# Patient Record
Sex: Female | Born: 1985 | Race: White | Hispanic: No | Marital: Married | Smoking: Never smoker
Health system: Southern US, Community
[De-identification: ages and names within clinical notes are randomized; demographics above are authoritative.]

## PROBLEM LIST (undated history)

## (undated) ENCOUNTER — Inpatient Hospital Stay (HOSPITAL_COMMUNITY): Payer: Self-pay

## (undated) DIAGNOSIS — Z789 Other specified health status: Secondary | ICD-10-CM

## (undated) DIAGNOSIS — F329 Major depressive disorder, single episode, unspecified: Secondary | ICD-10-CM

## (undated) DIAGNOSIS — F32A Depression, unspecified: Secondary | ICD-10-CM

## (undated) HISTORY — PX: WISDOM TOOTH EXTRACTION: SHX21

## (undated) HISTORY — PX: NO PAST SURGERIES: SHX2092

---
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
## 2007-04-26 ENCOUNTER — Emergency Department (HOSPITAL_COMMUNITY): Admission: EM | Admit: 2007-04-26 | Discharge: 2007-04-26 | Payer: Self-pay | Admitting: Emergency Medicine

## 2008-10-07 ENCOUNTER — Encounter (INDEPENDENT_AMBULATORY_CARE_PROVIDER_SITE_OTHER): Payer: Self-pay | Admitting: Internal Medicine

## 2008-10-08 ENCOUNTER — Encounter (INDEPENDENT_AMBULATORY_CARE_PROVIDER_SITE_OTHER): Payer: Self-pay | Admitting: Internal Medicine

## 2008-10-08 ENCOUNTER — Ambulatory Visit: Payer: Self-pay | Admitting: Internal Medicine

## 2008-10-08 DIAGNOSIS — F329 Major depressive disorder, single episode, unspecified: Secondary | ICD-10-CM

## 2008-10-08 LAB — CONVERTED CEMR LAB: Hgb A1c MFr Bld: 4.7 %

## 2008-10-09 LAB — CONVERTED CEMR LAB
ALT: 10 units/L (ref 0–35)
AST: 15 units/L (ref 0–37)
Albumin: 4.5 g/dL (ref 3.5–5.2)
Alkaline Phosphatase: 34 units/L — ABNORMAL LOW (ref 39–117)
BUN: 11 mg/dL (ref 6–23)
CO2: 23 meq/L (ref 19–32)
Calcium: 9.2 mg/dL (ref 8.4–10.5)
Chloride: 103 meq/L (ref 96–112)
Cholesterol: 237 mg/dL — ABNORMAL HIGH (ref 0–200)
Creatinine, Ser: 0.74 mg/dL (ref 0.40–1.20)
GFR calc Af Amer: >60 mL/min (ref >60)
GFR calc non Af Amer: >60 mL/min (ref >60)
Glucose, Bld: 85 mg/dL (ref 70–99)
HCT: 40.9 % (ref 36.0–46.0)
HDL: 85 mg/dL (ref >39)
Hemoglobin: 13.9 g/dL (ref 12.0–15.0)
LDL Cholesterol: 130 mg/dL — ABNORMAL HIGH (ref 0–99)
MCHC: 34 g/dL (ref 30.0–36.0)
MCV: 89.9 fL (ref 78.0–100.0)
Platelets: 265 10*3/uL (ref 150–400)
Potassium: 4.2 meq/L (ref 3.5–5.3)
RBC: 4.55 M/uL (ref 3.87–5.11)
RDW: 12.1 % (ref 11.5–15.5)
Sodium: 138 meq/L (ref 135–145)
TSH: 0.929 microintl units/mL (ref 0.350–4.500)
Total Bilirubin: 0.4 mg/dL (ref 0.3–1.2)
Total CHOL/HDL Ratio: 2.8
Total Protein: 7.5 g/dL (ref 6.0–8.3)
Triglycerides: 110 mg/dL (ref <150)
VLDL: 22 mg/dL (ref 0–40)
WBC: 4.6 10*3/uL (ref 4.0–10.5)

## 2008-10-13 ENCOUNTER — Telehealth: Payer: Self-pay | Admitting: Licensed Clinical Social Worker

## 2008-10-30 ENCOUNTER — Ambulatory Visit: Payer: Self-pay | Admitting: Internal Medicine

## 2008-10-30 ENCOUNTER — Encounter: Payer: Self-pay | Admitting: Licensed Clinical Social Worker

## 2009-01-07 ENCOUNTER — Encounter: Payer: Self-pay | Admitting: Internal Medicine

## 2009-01-07 ENCOUNTER — Ambulatory Visit: Payer: Self-pay | Admitting: Internal Medicine

## 2009-01-07 ENCOUNTER — Telehealth: Payer: Self-pay | Admitting: *Deleted

## 2009-01-07 LAB — CONVERTED CEMR LAB
Cholesterol: 187 mg/dL (ref 0–200)
HDL: 72 mg/dL (ref >39)
LDL Cholesterol: 104 mg/dL — ABNORMAL HIGH (ref 0–99)
Total CHOL/HDL Ratio: 2.6
Triglycerides: 57 mg/dL (ref <150)
VLDL: 11 mg/dL (ref 0–40)

## 2009-03-16 ENCOUNTER — Ambulatory Visit: Payer: Self-pay | Admitting: Infectious Disease

## 2009-03-16 ENCOUNTER — Telehealth: Payer: Self-pay | Admitting: Internal Medicine

## 2009-03-16 LAB — CONVERTED CEMR LAB: Streptococcus, Group A Screen (Direct): NEGATIVE

## 2010-06-17 ENCOUNTER — Other Ambulatory Visit (HOSPITAL_COMMUNITY)
Admission: RE | Admit: 2010-06-17 | Discharge: 2010-06-17 | Disposition: A | Discharge status: Home / Self Care | Payer: BC Managed Care – PPO | Source: Ambulatory Visit | Attending: Family Medicine | Admitting: Family Medicine

## 2010-06-17 DIAGNOSIS — Z01419 Encounter for gynecological examination (general) (routine) without abnormal findings: Secondary | ICD-10-CM | POA: Insufficient documentation

## 2010-07-11 ENCOUNTER — Encounter: Payer: Self-pay | Admitting: Internal Medicine

## 2011-02-20 LAB — RAPID STREP SCREEN (MED CTR MEBANE ONLY): Streptococcus, Group A Screen (Direct): POSITIVE — AB

## 2011-03-12 ENCOUNTER — Emergency Department (HOSPITAL_COMMUNITY): Payer: No Typology Code available for payment source

## 2011-03-12 ENCOUNTER — Emergency Department (HOSPITAL_COMMUNITY)
Admission: EM | Admit: 2011-03-12 | Discharge: 2011-03-12 | Disposition: A | Discharge status: Home / Self Care | Payer: No Typology Code available for payment source | Attending: Emergency Medicine | Admitting: Emergency Medicine

## 2011-03-12 DIAGNOSIS — M545 Low back pain, unspecified: Secondary | ICD-10-CM | POA: Insufficient documentation

## 2011-03-12 DIAGNOSIS — M79609 Pain in unspecified limb: Secondary | ICD-10-CM | POA: Insufficient documentation

## 2011-03-12 DIAGNOSIS — M25559 Pain in unspecified hip: Secondary | ICD-10-CM | POA: Insufficient documentation

## 2011-03-12 DIAGNOSIS — S8010XA Contusion of unspecified lower leg, initial encounter: Secondary | ICD-10-CM | POA: Insufficient documentation

## 2012-07-08 ENCOUNTER — Inpatient Hospital Stay (HOSPITAL_COMMUNITY): Admission: AD | Admit: 2012-07-08 | Payer: Self-pay | Source: Ambulatory Visit | Admitting: Obstetrics and Gynecology

## 2012-08-05 LAB — OB RESULTS CONSOLE HEPATITIS B SURFACE ANTIGEN: Hepatitis B Surface Ag: NEGATIVE

## 2012-08-05 LAB — OB RESULTS CONSOLE RPR: RPR: NONREACTIVE

## 2012-08-05 LAB — OB RESULTS CONSOLE ANTIBODY SCREEN: Antibody Screen: NEGATIVE

## 2013-01-17 LAB — OB RESULTS CONSOLE GC/CHLAMYDIA
Chlamydia: NEGATIVE
Chlamydia: NEGATIVE
Gonorrhea: NEGATIVE

## 2013-01-17 LAB — OB RESULTS CONSOLE GBS: GBS: NEGATIVE

## 2013-02-09 ENCOUNTER — Inpatient Hospital Stay (HOSPITAL_COMMUNITY): Admission: AD | Admit: 2013-02-09 | Payer: Self-pay | Source: Ambulatory Visit | Admitting: Obstetrics and Gynecology

## 2013-02-10 ENCOUNTER — Inpatient Hospital Stay (HOSPITAL_COMMUNITY): Payer: Self-pay | Admitting: Anesthesiology

## 2013-02-10 ENCOUNTER — Encounter (HOSPITAL_COMMUNITY): Payer: Self-pay | Admitting: *Deleted

## 2013-02-10 ENCOUNTER — Encounter (HOSPITAL_COMMUNITY): Payer: Self-pay | Admitting: Anesthesiology

## 2013-02-10 ENCOUNTER — Inpatient Hospital Stay (HOSPITAL_COMMUNITY)
Admission: AD | Admit: 2013-02-10 | Discharge: 2013-02-12 | DRG: 774 | Disposition: A | Discharge status: Home / Self Care | Payer: Self-pay | Source: Ambulatory Visit | Attending: Obstetrics and Gynecology | Admitting: Obstetrics and Gynecology

## 2013-02-10 DIAGNOSIS — O99344 Other mental disorders complicating childbirth: Secondary | ICD-10-CM | POA: Diagnosis present

## 2013-02-10 DIAGNOSIS — Z141 Cystic fibrosis carrier: Secondary | ICD-10-CM

## 2013-02-10 DIAGNOSIS — F411 Generalized anxiety disorder: Secondary | ICD-10-CM | POA: Diagnosis present

## 2013-02-10 LAB — COMPREHENSIVE METABOLIC PANEL
ALT: 12 U/L (ref 0–35)
AST: 15 U/L (ref 0–37)
Alkaline Phosphatase: 136 U/L — ABNORMAL HIGH (ref 39–117)
BUN: 9 mg/dL (ref 6–23)
CO2: 19 mEq/L (ref 19–32)
Calcium: 9 mg/dL (ref 8.4–10.5)
Chloride: 100 mEq/L (ref 96–112)
GFR calc Af Amer: >90 mL/min (ref >90)
GFR calc non Af Amer: >90 mL/min (ref >90)
Glucose, Bld: 90 mg/dL (ref 70–99)
Potassium: 4 mEq/L (ref 3.5–5.1)
Sodium: 133 mEq/L — ABNORMAL LOW (ref 135–145)

## 2013-02-10 LAB — CBC
HCT: 33.5 % — ABNORMAL LOW (ref 36.0–46.0)
Hemoglobin: 10.8 g/dL — ABNORMAL LOW (ref 12.0–15.0)
Hemoglobin: 9.7 g/dL — ABNORMAL LOW (ref 12.0–15.0)
MCH: 26.8 pg (ref 26.0–34.0)
MCH: 27.5 pg (ref 26.0–34.0)
MCHC: 32.2 g/dL (ref 30.0–36.0)
MCV: 83.1 fL (ref 78.0–100.0)
Platelets: 208 10*3/uL (ref 150–400)
RBC: 4.03 MIL/uL (ref 3.87–5.11)
RBC: 3.53 MIL/uL — ABNORMAL LOW (ref 3.87–5.11)
RDW: 14.3 % (ref 11.5–15.5)
WBC: 12.8 10*3/uL — ABNORMAL HIGH (ref 4.0–10.5)

## 2013-02-10 LAB — URIC ACID: Uric Acid, Serum: 4.2 mg/dL (ref 2.4–7.0)

## 2013-02-10 LAB — TYPE AND SCREEN: ABO/RH(D): A POS

## 2013-02-10 LAB — PROTEIN / CREATININE RATIO, URINE
Protein Creatinine Ratio: 0.23 — ABNORMAL HIGH (ref 0.00–0.15)
Total Protein, Urine: 12.4 mg/dL

## 2013-02-10 LAB — RPR: RPR Ser Ql: NONREACTIVE

## 2013-02-10 MED ORDER — SIMETHICONE 80 MG PO CHEW: 80.0000 mg | CHEWABLE_TABLET | ORAL | Status: DC | PRN

## 2013-02-10 MED ORDER — LANOLIN HYDROUS EX OINT: TOPICAL_OINTMENT | CUTANEOUS | Status: DC | PRN

## 2013-02-10 MED ORDER — EPHEDRINE 5 MG/ML INJ
10.0000 mg | INTRAVENOUS | Status: DC | PRN
  Filled 2013-02-10: qty 2
  Filled 2013-02-10: qty 4

## 2013-02-10 MED ORDER — FERROUS SULFATE 325 (65 FE) MG PO TABS
325.0000 mg | ORAL_TABLET | Freq: Two times a day (BID) | ORAL | Status: DC
  Administered 2013-02-11 – 2013-02-12 (×3): 325 mg via ORAL
  Filled 2013-02-10 (×3): qty 1

## 2013-02-10 MED ORDER — ONDANSETRON HCL 4 MG/2ML IJ SOLN: 4.0000 mg | INTRAMUSCULAR | Status: DC | PRN

## 2013-02-10 MED ORDER — PRENATAL MULTIVITAMIN CH
1.0000 | ORAL_TABLET | Freq: Every day | ORAL | Status: DC
  Administered 2013-02-11 – 2013-02-12 (×2): 1 via ORAL
  Filled 2013-02-10 (×2): qty 1

## 2013-02-10 MED ORDER — OXYTOCIN 40 UNITS IN LACTATED RINGERS INFUSION - SIMPLE MED
62.5000 mL/h | INTRAVENOUS | Status: DC
  Filled 2013-02-10: qty 1000

## 2013-02-10 MED ORDER — ZOLPIDEM TARTRATE 5 MG PO TABS: 5.0000 mg | ORAL_TABLET | Freq: Every evening | ORAL | Status: DC | PRN

## 2013-02-10 MED ORDER — FENTANYL 2.5 MCG/ML BUPIVACAINE 1/10 % EPIDURAL INFUSION (WH - ANES)
14.0000 mL/h | INTRAMUSCULAR | Status: DC | PRN
  Filled 2013-02-10: qty 125

## 2013-02-10 MED ORDER — ONDANSETRON HCL 4 MG/2ML IJ SOLN: 4.0000 mg | Freq: Four times a day (QID) | INTRAMUSCULAR | Status: DC | PRN

## 2013-02-10 MED ORDER — OXYCODONE-ACETAMINOPHEN 5-325 MG PO TABS: 1.0000 | ORAL_TABLET | ORAL | Status: DC | PRN

## 2013-02-10 MED ORDER — LACTATED RINGERS IV SOLN: 500.0000 mL | Freq: Once | INTRAVENOUS | Status: DC

## 2013-02-10 MED ORDER — BENZOCAINE-MENTHOL 20-0.5 % EX AERO
1.0000 "application " | INHALATION_SPRAY | CUTANEOUS | Status: DC | PRN
  Administered 2013-02-11: 1 via TOPICAL
  Filled 2013-02-10: qty 56

## 2013-02-10 MED ORDER — FENTANYL 2.5 MCG/ML BUPIVACAINE 1/10 % EPIDURAL INFUSION (WH - ANES)
INTRAMUSCULAR | Status: DC | PRN
  Administered 2013-02-10: 14 mL/h via EPIDURAL

## 2013-02-10 MED ORDER — ONDANSETRON HCL 4 MG PO TABS: 4.0000 mg | ORAL_TABLET | ORAL | Status: DC | PRN

## 2013-02-10 MED ORDER — LACTATED RINGERS IV SOLN: 500.0000 mL | INTRAVENOUS | Status: DC | PRN

## 2013-02-10 MED ORDER — DIPHENHYDRAMINE HCL 50 MG/ML IJ SOLN: 12.5000 mg | INTRAMUSCULAR | Status: DC | PRN

## 2013-02-10 MED ORDER — FENTANYL CITRATE 0.05 MG/ML IJ SOLN
INTRAMUSCULAR | Status: AC
  Administered 2013-02-10: 100 ug via INTRAVENOUS
  Filled 2013-02-10: qty 2

## 2013-02-10 MED ORDER — CITRIC ACID-SODIUM CITRATE 334-500 MG/5ML PO SOLN: 30.0000 mL | ORAL | Status: DC | PRN

## 2013-02-10 MED ORDER — EPHEDRINE 5 MG/ML INJ
10.0000 mg | INTRAVENOUS | Status: DC | PRN
  Filled 2013-02-10: qty 2

## 2013-02-10 MED ORDER — IBUPROFEN 600 MG PO TABS: 600.0000 mg | ORAL_TABLET | Freq: Four times a day (QID) | ORAL | Status: DC | PRN

## 2013-02-10 MED ORDER — IBUPROFEN 600 MG PO TABS
600.0000 mg | ORAL_TABLET | Freq: Four times a day (QID) | ORAL | Status: DC
  Administered 2013-02-10 – 2013-02-12 (×7): 600 mg via ORAL
  Filled 2013-02-10 (×7): qty 1

## 2013-02-10 MED ORDER — PHENYLEPHRINE 40 MCG/ML (10ML) SYRINGE FOR IV PUSH (FOR BLOOD PRESSURE SUPPORT)
80.0000 ug | PREFILLED_SYRINGE | INTRAVENOUS | Status: DC | PRN
  Filled 2013-02-10: qty 2

## 2013-02-10 MED ORDER — LACTATED RINGERS IV SOLN
INTRAVENOUS | Status: DC
  Administered 2013-02-10: 12:00:00 via INTRAVENOUS
  Administered 2013-02-10: 125 mL/h via INTRAVENOUS

## 2013-02-10 MED ORDER — PHENYLEPHRINE 40 MCG/ML (10ML) SYRINGE FOR IV PUSH (FOR BLOOD PRESSURE SUPPORT)
80.0000 ug | PREFILLED_SYRINGE | INTRAVENOUS | Status: DC | PRN
  Filled 2013-02-10: qty 5
  Filled 2013-02-10: qty 2

## 2013-02-10 MED ORDER — ACETAMINOPHEN 325 MG PO TABS
650.0000 mg | ORAL_TABLET | ORAL | Status: DC | PRN
  Administered 2013-02-10: 650 mg via ORAL
  Filled 2013-02-10: qty 2

## 2013-02-10 MED ORDER — WITCH HAZEL-GLYCERIN EX PADS: 1.0000 "application " | MEDICATED_PAD | CUTANEOUS | Status: DC | PRN

## 2013-02-10 MED ORDER — FENTANYL CITRATE 0.05 MG/ML IJ SOLN
100.0000 ug | INTRAMUSCULAR | Status: DC | PRN
  Administered 2013-02-10: 100 ug via INTRAVENOUS

## 2013-02-10 MED ORDER — LIDOCAINE HCL (PF) 1 % IJ SOLN
30.0000 mL | INTRAMUSCULAR | Status: DC | PRN
  Filled 2013-02-10 (×2): qty 30

## 2013-02-10 MED ORDER — SENNOSIDES-DOCUSATE SODIUM 8.6-50 MG PO TABS
2.0000 | ORAL_TABLET | ORAL | Status: DC
  Administered 2013-02-10 – 2013-02-12 (×2): 2 via ORAL

## 2013-02-10 MED ORDER — OXYTOCIN BOLUS FROM INFUSION
500.0000 mL | INTRAVENOUS | Status: DC
  Administered 2013-02-10: 500 mL via INTRAVENOUS

## 2013-02-10 MED ORDER — DIBUCAINE 1 % RE OINT: 1.0000 "application " | TOPICAL_OINTMENT | RECTAL | Status: DC | PRN

## 2013-02-10 MED ORDER — LIDOCAINE HCL (PF) 1 % IJ SOLN
INTRAMUSCULAR | Status: DC | PRN
  Administered 2013-02-10: 4 mL
  Administered 2013-02-10: 30 mL
  Administered 2013-02-10: 4 mL

## 2013-02-10 MED ORDER — TETANUS-DIPHTH-ACELL PERTUSSIS 5-2.5-18.5 LF-MCG/0.5 IM SUSP: 0.5000 mL | Freq: Once | INTRAMUSCULAR | Status: DC

## 2013-02-10 NOTE — MAU Note (Signed)
Contractions every 4-7 minutes for the last two hours. Denies gushes of fluid and vaginal bleeding

## 2013-02-10 NOTE — H&P (Signed)
Amy Jacobson is a 27 y.o. female presenting for labor check at [redacted]w[redacted]d. Pt denies any VB or LOF, +FM. Denies any HA/N/V/RUQ pain, does have hx anxiety, ctx began to get more regular and stronger about 3am.  HPI: Pt tx care to [facility] at 32wks,  from [facility] desires WB. Pt began PNC at 8wks, EDC confirmed w early [place] c/w LMP dating =02/09/13. 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 Anatomy scan at [facility] WNL w posterior placenta  Normal 1hr gtt in 3rd trimester Otherwise normal PNC, GBS, GC/CT neg  Maternal Medical History:  Reason for admission: Contractions.  Nausea.  Contractions: Onset was 3-5 hours ago.   Frequency: regular.   Duration is approximately 60 seconds.   Perceived severity is moderate.    Fetal activity: Perceived fetal activity is normal.   Last perceived fetal movement was within the past hour.    Prenatal complications: no prenatal complications Prenatal Complications - Diabetes: none.    OB History   Grav Para Term Preterm Abortions TAB SAB Ect Mult Living   1              History reviewed. No pertinent past medical history. History reviewed. No pertinent past surgical history. Family History: family history includes Diabetes in her father. Social History:  reports that she has never smoked. She does not have any smokeless tobacco history on file. She reports that she does not drink alcohol or use illicit drugs.   Prenatal Transfer Tool  Maternal Diabetes: No Genetic Screening: Declined Maternal Ultrasounds/Referrals: Normal Fetal Ultrasounds or other Referrals:  None Maternal Substance Abuse:  No Significant Maternal Medications:  No  Significant Maternal Lab Results:  Lab values include: Group B Strep negative Other Comments:  None  Review of Systems  Eyes: Negative for blurred vision.  Gastrointestinal: Negative for nausea, vomiting and abdominal pain.  Neurological: Negative for headaches.  Psychiatric/Behavioral: The patient is  nervous/anxious.   All other systems reviewed and are negative.    Dilation: 5 Effacement (%): 100 Exam by:: Krystel Fletchall CNM Blood pressure 122/84, pulse 113, temperature 97.9 F (36.6 C), temperature source Oral, resp. rate 20, height 5\' 2"  (1.575 m), weight 194 lb 8 oz (88.225 kg). Maternal Exam:  Uterine Assessment: Contraction strength is moderate.  Contraction duration is 60 seconds. Contraction frequency is regular.   Abdomen: Patient reports no abdominal tenderness. Fundal height is aga.   Estimated fetal weight is 7-8#.   Fetal presentation: vertex  Introitus: Normal vulva. Normal vagina.  Pelvis: adequate for delivery.   Cervix: Cervix evaluated by digital exam.     Fetal Exam Fetal Monitor Review: Mode: ultrasound.   Baseline rate: 140.  Variability: moderate (6-25 bpm).   Pattern: accelerations present and no decelerations.    Fetal State Assessment: Category I - tracings are normal.     Physical Exam  Nursing note and vitals reviewed. Constitutional: She is oriented to person, place, and time. She appears well-developed and well-nourished. She appears distressed.  Grimacing and moaning w ctx, tense  HENT:  Head: Normocephalic.  Eyes: Pupils are equal, round, and reactive to light.  Neck: Normal range of motion.  Cardiovascular: Normal rate, regular rhythm and normal heart sounds.   Respiratory: Effort normal and breath sounds normal.  GI: Soft. Bowel sounds are normal.  Genitourinary: Vagina normal.  Musculoskeletal: Normal range of motion.  Neurological: She is alert and oriented to person, place, and time. She has normal reflexes.  Skin: Skin is  warm and dry.  Psychiatric: She has a normal mood and affect. Her behavior is normal.    Prenatal labs: ABO, Rh:  A pos 3/24 Antibody:  neg 3/24 Rubella:  IMM 3/24 RPR:   NR 3/24 HBsAg:   neg 3/24 HIV:   neg 3/24  GBS: Negative (09/05 0000)  GC/CT neg 9/5   Assessment/Plan: IUP at [redacted]w[redacted]d Active labor FHR  reassuring Elevated BP GBS neg Pt desires WB  Admit to b.s. Per c/w Dr Stefano Gaul Routine L&D orders  Will check PIH labs and PCR to r/o pre-eclampsia  CTO BP closely  Expectant mgmnt Ok for WB if BP normalizes and no sx's pre-eclampsia   Jahnia Hewes M 02/10/2013, 8:15 AM

## 2013-02-10 NOTE — Progress Notes (Signed)
Amy Jacobson is a 27 y.o. G1P0 at [redacted]w[redacted]d by LMP admitted for active labor  Subjective: Pt breathing through contractions in tub, but now asking for analgesia IV.  Cx 8 cm per RN now that pt SROM'D.    Objective: BP 144/72  Pulse 98  Temp(Src) 99 F (37.2 C) (Oral)  Resp 20  Ht 5\' 2"  (1.575 m)  Wt 194 lb 8 oz (88.225 kg)  BMI 35.57 kg/m2      FHT:  140'S WITH some variables.  Pt will come out of water for longer tracing prior to analgesia UC:   regular, every 3 minutes SVE:   Dilation: Lip/rim Effacement (%): 100 Station: 0 Exam by:: Sowder,RNC   Labs: Lab Results  Component Value Date   WBC 7.1 02/10/2013   HGB 10.8* 02/10/2013   HCT 33.5* 02/10/2013   MCV 83.1 02/10/2013   PLT 240 02/10/2013    Assessment / Plan: Spontaneous labor, progressing normally  Labor: decrease in cervical dilation after SROM Preeclampsia:  no signs or symptoms of toxicity and PIH labs all nl Fetal Wellbeing:  Category I Pain Control:  Fentanyl I/D:  n/a Anticipated MOD:  NSVD  [name] P 02/10/2013, 9:54 AM

## 2013-02-10 NOTE — Anesthesia Procedure Notes (Signed)
Epidural Patient location during procedure: OB Start time: 02/10/2013 10:46 AM  Staffing Anesthesiologist: Cionna Collantes A. Performed by: anesthesiologist   Preanesthetic Checklist Completed: patient identified, site marked, surgical consent, pre-op evaluation, timeout performed, IV checked, risks and benefits discussed and monitors and equipment checked  Epidural Patient position: sitting Prep: site prepped and draped and DuraPrep Patient monitoring: continuous pulse ox and blood pressure Approach: midline Injection technique: LOR air  Needle:  Needle type: Tuohy  Needle gauge: 17 G Needle length: 9 cm and 9 Needle insertion depth: 6 cm Catheter type: closed end flexible Catheter size: 19 Gauge Catheter at skin depth: 11 cm Test dose: negative and Other  Assessment Events: blood not aspirated, injection not painful, no injection resistance, negative IV test and no paresthesia  Additional Notes Patient identified. Risks and benefits discussed including failed block, incomplete  Pain control, post dural puncture headache, nerve damage, paralysis, blood pressure Changes, nausea, vomiting, reactions to medications-both toxic and allergic and post Partum back pain. All questions were answered. Patient expressed understanding and wished to proceed. Sterile technique was used throughout procedure. Epidural site was Dressed with sterile barrier dressing. No paresthesias, signs of intravascular injection Or signs of intrathecal spread were encountered.  Patient was more comfortable after the epidural was dosed. Please see RN's note for documentation of vital signs and FHR which are stable.

## 2013-02-10 NOTE — Op Note (Signed)
Delivery Note At 5:09 PM a viable female was delivered via Vaginal, Spontaneous Delivery (Presentation: ; Occiput Anterior).  APGAR: 9, 9; weight pending   Placenta status: Intact, Spontaneous.  Cord: 3 vessels with the following complications: None.  Cord pH: not done  Anesthesia: Epidural  Episiotomy: None Lacerations: 2nd degree;Perineal Suture Repair: 3.0 vicryl Est. Blood Loss (mL): 300  Mom to postpartum.  Baby to skin to skin.  Pavan Bring P 02/10/2013, 5:57 PM

## 2013-02-10 NOTE — Progress Notes (Signed)
Amy Jacobson is a 27 y.o. G1P0 at [redacted]w[redacted]d by ultrasound admitted for active labor  Subjective: Patient decided that analgesia after no was inadequate and requested an epidural. Since that time. She has been in spontaneous labor and progressing slowly. She declined Pitocin augmentation. She subsequently progressed to complete and began pushing at approximately 1600.  Objective: BP 127/62  Pulse 121  Temp(Src) 98.3 F (36.8 C) (Oral)  Resp 20  Ht 5\' 2"  (1.575 m)  Wt 194 lb 8 oz (88.225 kg)  BMI 35.57 kg/m2  SpO2 100%   Total I/O In: -  Out: 500 [Urine:200; Blood:300]  FHT:  Mild tachycardia noted. Just prior to pushing. Since pushing the patient has had variable decelerations with most contractions, but good return to a baseline in the 150s. UC:   regular, every 2-3 minutes SVE:   Dilation: 10 Effacement (%): 100 Station: +2 Exam by:: Sowder,RNC  Labs: Lab Results  Component Value Date   WBC 7.1 02/10/2013   HGB 10.8* 02/10/2013   HCT 33.5* 02/10/2013   MCV 83.1 02/10/2013   PLT 240 02/10/2013    Assessment / Plan: Protracted active phase  Labor: Patient declined Pitocin for protracted active phase Preeclampsia:  no signs or symptoms of toxicity Fetal Wellbeing:  Category II Pain Control:  Epidural adequate I/D:  Will observe closely for occurrence of a fever. The patient is given Tylenol and a fluid bolus with good fetal heart rate response. Anticipated MOD:  NSVD  Sivan Quast P 02/10/2013, 5:44 PM

## 2013-02-10 NOTE — Anesthesia Preprocedure Evaluation (Signed)
Anesthesia Evaluation  Patient identified by MRN, date of birth, ID band Patient awake    Reviewed: Allergy & Precautions, H&P , Patient's Chart, lab work & pertinent test results  Airway Mallampati: III TM Distance: >3 FB Neck ROM: Full    Dental no notable dental hx. (+) Teeth Intact   Pulmonary neg pulmonary ROS,  breath sounds clear to auscultation  Pulmonary exam normal       Cardiovascular negative cardio ROS  Rhythm:Regular Rate:Normal     Neuro/Psych PSYCHIATRIC DISORDERS Depression negative neurological ROS     GI/Hepatic Neg liver ROS, GERD-  ,  Endo/Other  negative endocrine ROSObesity  Renal/GU negative Renal ROS  negative genitourinary   Musculoskeletal negative musculoskeletal ROS (+)   Abdominal (+) + obese,   Peds  Hematology negative hematology ROS (+)   Anesthesia Other Findings   Reproductive/Obstetrics                           Anesthesia Physical Anesthesia Plan  ASA: II  Anesthesia Plan: Epidural   Post-op Pain Management:    Induction:   Airway Management Planned: Natural Airway  Additional Equipment:   Intra-op Plan:   Post-operative Plan:   Informed Consent: I have reviewed the patients History and Physical, chart, labs and discussed the procedure including the risks, benefits and alternatives for the proposed anesthesia with the patient or authorized representative who has indicated his/her understanding and acceptance.   Dental advisory given  Plan Discussed with: Anesthesiologist  Anesthesia Plan Comments:         Anesthesia Quick Evaluation

## 2013-02-10 NOTE — Progress Notes (Signed)
Discussing POC for pitocin with patient and family.  Risks of prolonged active phase with ROM discussed.  Pt refusing pitocin at this time.  Wishes to sit up and try gravity.  Pitocin held for now and Dr. Pennie Rushing notified.

## 2013-02-11 ENCOUNTER — Encounter (HOSPITAL_COMMUNITY): Payer: Self-pay | Admitting: *Deleted

## 2013-02-11 LAB — CBC
HCT: 25.8 % — ABNORMAL LOW (ref 36.0–46.0)
Hemoglobin: 8.6 g/dL — ABNORMAL LOW (ref 12.0–15.0)
MCH: 27.7 pg (ref 26.0–34.0)
MCHC: 33.3 g/dL (ref 30.0–36.0)
MCV: 83.2 fL (ref 78.0–100.0)

## 2013-02-11 MED ORDER — OXYCODONE-ACETAMINOPHEN 5-325 MG PO TABS: 1.0000 | ORAL_TABLET | ORAL | Status: DC | PRN

## 2013-02-11 MED ORDER — IBUPROFEN 800 MG PO TABS: 800.0000 mg | ORAL_TABLET | Freq: Three times a day (TID) | ORAL | Status: DC | PRN

## 2013-02-11 MED ORDER — FERROUS SULFATE 325 (65 FE) MG PO TABS: 325.0000 mg | ORAL_TABLET | Freq: Two times a day (BID) | ORAL | Status: DC

## 2013-02-11 MED ORDER — DOCUSATE SODIUM 100 MG PO CAPS: 100.0000 mg | ORAL_CAPSULE | Freq: Two times a day (BID) | ORAL | Status: DC

## 2013-02-11 NOTE — Progress Notes (Signed)
Patient had been d/c'd today by Dr. Stefano Gaul on day 1, but baby is not to be d/c'd until tomorrow. Patient requests her d/c be cancelled--d/c order cancelled. Will anticipate d/c tomorrow.  Nigel Bridgeman, CNM 02/11/13 8:15p

## 2013-02-11 NOTE — Discharge Summary (Addendum)
Vaginal Delivery Discharge Summary  Amy Jacobson  DOB:    Aug 02, 1985 MRN:    [id number] CSN:    [id number]  Date of admission:                  02/10/2013  Date of discharge:                   02/12/2013  Procedures this admission:  Date of Delivery: 02/10/2013 normal spontaneous vaginal delivery by Dr. Dierdre Forth  Newborn Data:  Live born female  Birth Weight: 7 lb 13.4 oz (3555 g) APGAR: 9, 9  Home with mother. Name: Esekial Circumcision Plan: Outpatient  History of Present Illness:  Amy Jacobson is a 27 y.o. female, G1P1001, who presents at [redacted]w[redacted]d weeks gestation. The patient has been followed at the [facility] division of [facility]. She was admitted onset of labor. Her pregnancy has been complicated by: Anxiety.  Hospital course:  The patient was admitted for labor.   Her labor was not complicated. She initially planned waterbirth, then elected to proceed with epidural.  She proceeded to have a vaginal delivery of a healthy infant. Her delivery was not complicated. Her postpartum course was not complicated. She was discharged to home on postpartum day 1 doing well.  Feeding:  breast  Contraception:  no method  Discharge hemoglobin:  Hemoglobin  Date Value Range Status  02/11/2013 8.6* 12.0 - 15.0 g/dL Final     HCT  Date Value Range Status  02/11/2013 25.8* 36.0 - 46.0 % Final    Discharge Physical Exam:   Orthostatic blood pressures and pulses are normal. The patient is able to ambulate without difficulty.  General: no distress Lochia: appropriate Uterine Fundus: firm Incision: NA DVT Evaluation: No evidence of DVT seen on physical exam.  Intrapartum Procedures: spontaneous vaginal delivery Postpartum Procedures: none Complications-Operative and Postpartum: Second degree perineal laceration  Discharge Diagnoses: Term Pregnancy-delivered  Discharge Information:  Activity:            pelvic rest Diet:                routine Medications: PNV, Ibuprofen, Colace, Iron and Percocet Condition:      stable and improved Instructions:   Postpartum Care After Vaginal Delivery  After you deliver your newborn (postpartum period), the usual stay in the hospital is 24 72 hours. If there were problems with your labor or delivery, or if you have other medical problems, you might be in the hospital longer.  While you are in the hospital, you will receive help and instructions on how to care for yourself and your newborn during the postpartum period.  While you are in the hospital:  Be sure to tell your nurses if you have pain or discomfort, as well as where you feel the pain and what makes the pain worse.  If you had an incision made near your vagina (episiotomy) or if you had some tearing during delivery, the nurses may put ice packs on your episiotomy or tear. The ice packs may help to reduce the pain and swelling.  If you are breastfeeding, you may feel uncomfortable contractions of your uterus for a couple of weeks. This is normal. The contractions help your uterus get back to normal size.  It is normal to have some bleeding after delivery.  For the first 1 3 days after delivery, the flow is red and the amount may be similar to  a period.  It is common for the flow to start and stop.  In the first few days, you may pass some small clots. Let your nurses know if you begin to pass large clots or your flow increases.  Do not  flush blood clots down the toilet before having the nurse look at them.  During the next 3 10 days after delivery, your flow should become more watery and pink or brown-tinged in color.  Ten to fourteen days after delivery, your flow should be a small amount of yellowish-white discharge.  The amount of your flow will decrease over the first few weeks after delivery. Your flow may stop in 6 8 weeks. Most women have had their flow stop by 12 weeks after  delivery.  You should change your sanitary pads frequently.  Wash your hands thoroughly with soap and water for at least 20 seconds after changing pads, using the toilet, or before holding or feeding your newborn.  You should feel like you need to empty your bladder within the first 6 8 hours after delivery.  In case you become weak, lightheaded, or faint, call your nurse before you get out of bed for the first time and before you take a shower for the first time.  Within the first few days after delivery, your breasts may begin to feel tender and full. This is called engorgement. Breast tenderness usually goes away within 48 72 hours after engorgement occurs. You may also notice milk leaking from your breasts. If you are not breastfeeding, do not stimulate your breasts. Breast stimulation can make your breasts produce more milk.  Spending as much time as possible with your newborn is very important. During this time, you and your newborn can feel close and get to know each other. Having your newborn stay in your room (rooming in) will help to strengthen the bond with your newborn. It will give you time to get to know your newborn and become comfortable caring for your newborn.  Your hormones change after delivery. Sometimes the hormone changes can temporarily cause you to feel sad or tearful. These feelings should not last more than a few days. If these feelings last longer than that, you should talk to your caregiver.  If desired, talk to your caregiver about methods of family planning or contraception.  Talk to your caregiver about immunizations. Your caregiver may want you to have the following immunizations before leaving the hospital:  Tetanus, diphtheria, and pertussis (Tdap) or tetanus and diphtheria (Td) immunization. It is very important that you and your family (including grandparents) or others caring for your newborn are up-to-date with the Tdap or Td immunizations. The Tdap or Td  immunization can help protect your newborn from getting ill.  Rubella immunization.  Varicella (chickenpox) immunization.  Influenza immunization. You should receive this annual immunization if you did not receive the immunization during your pregnancy. Document Released: 02/26/2007 Document Revised: 01/24/2012 Document Reviewed: 12/27/2011 [facility] Patient Information 2014 [place], [place].   Postpartum Depression and Baby Blues  The postpartum period begins right after the birth of a baby. During this time, there is often a great amount of joy and excitement. It is also a time of considerable changes in the life of the parent(s). Regardless of how many times a mother gives birth, each child brings new challenges and dynamics to the family. It is not unusual to have feelings of excitement accompanied by confusing shifts in moods, emotions, and thoughts. All mothers are at risk  of developing postpartum depression or the "baby blues." These mood changes can occur right after giving birth, or they may occur many months after giving birth. The baby blues or postpartum depression can be mild or severe. Additionally, postpartum depression can resolve rather quickly, or it can be a long-term condition. CAUSES Elevated hormones and their rapid decline are thought to be a main cause of postpartum depression and the baby blues. There are a number of hormones that radically change during and after pregnancy. Estrogen and progesterone usually decrease immediately after delivering your baby. The level of thyroid hormone and various cortisol steroids also rapidly drop. Other factors that play a major role in these changes include major life events and genetics.  RISK FACTORS If you have any of the following risks for the baby blues or postpartum depression, know what symptoms to watch out for during the postpartum period. Risk factors that may increase the likelihood of getting the baby blues or postpartum  depression include:  Havinga personal or family history of depression.  Having depression while being pregnant.  Having premenstrual or oral contraceptive-associated mood issues.  Having exceptional life stress.  Having marital conflict.  Lacking a social support network.  Having a baby with special needs.  Having health problems such as diabetes. SYMPTOMS Baby blues symptoms include:  Brief fluctuations in mood, such as going from extreme happiness to sadness.  Decreased concentration.  Difficulty sleeping.  Crying spells, tearfulness.  Irritability.  Anxiety. Postpartum depression symptoms typically begin within the first month after giving birth. These symptoms include:  Difficulty sleeping or excessive sleepiness.  Marked weight loss.  Agitation.  Feelings of worthlessness.  Lack of interest in activity or food. Postpartum psychosis is a very concerning condition and can be dangerous. Fortunately, it is rare. Displaying any of the following symptoms is cause for immediate medical attention. Postpartum psychosis symptoms include:  Hallucinations and delusions.  Bizarre or disorganized behavior.  Confusion or disorientation. DIAGNOSIS  A diagnosis is made by an evaluation of your symptoms. There are no medical or lab tests that lead to a diagnosis, but there are various questionnaires that a caregiver may use to identify those with the baby blues, postpartum depression, or psychosis. Often times, a screening tool called the [place] Postnatal Depression Scale is used to diagnose depression in the postpartum period.  TREATMENT The baby blues usually goes away on its own in 1 to 2 weeks. Social support is often all that is needed. You should be encouraged to get adequate sleep and rest. Occasionally, you may be given medicines to help you sleep.  Postpartum depression requires treatment as it can last several months or longer if it is not treated. Treatment may  include individual or group therapy, medicine, or both to address any social, physiological, and psychological factors that may play a role in the depression. Regular exercise, a healthy diet, rest, and social support may also be strongly recommended.  Postpartum psychosis is more serious and needs treatment right away. Hospitalization is often needed. HOME CARE INSTRUCTIONS  Get as much rest as you can. Nap when the baby sleeps.  Exercise regularly. Some women find yoga and walking to be beneficial.  Eat a balanced and nourishing diet.  Do little things that you enjoy. Have a cup of tea, take a bubble bath, read your favorite magazine, or listen to your favorite music.  Avoid alcohol.  Ask for help with household chores, cooking, grocery shopping, or running errands as needed. Do not try  to do everything.  Talk to people close to you about how you are feeling. Get support from your partner, family members, friends, or other new moms.  Try to stay positive in how you think. Think about the things you are grateful for.  Do not spend a lot of time alone.  Only take medicine as directed by your caregiver.  Keep all your postpartum appointments.  Let your caregiver know if you have any concerns. SEEK MEDICAL CARE IF: You are having a reaction or problems with your medicine. SEEK IMMEDIATE MEDICAL CARE IF:  You have suicidal feelings.  You feel you may harm the baby or someone else. Document Released: 02/03/2004 Document Revised: 07/24/2011 Document Reviewed: 03/07/2011 [facility] Patient Information 2014 [place], [place].   Discharge to: home  Follow-up Information   Follow up with [facility] In 6 weeks.   Contact information:   [address] [place] [address]       Nigel Bridgeman, CNM 02/12/13

## 2013-02-11 NOTE — Progress Notes (Signed)
Patient was referred for history of depression/anxiety.  * Referral screened out by Clinical Social Worker because none of the following criteria appear to apply:  ~ History of anxiety/depression during this pregnancy, or of post-partum depression.  ~ Diagnosis of anxiety and/or depression within last 4 years, per pt.  ~ History of depression due to pregnancy loss/loss of child  OR  * Patient's symptoms currently being treated with medication and/or therapy.  Please contact the Clinical Social Worker if needs arise, or by the patient's request.       

## 2013-02-11 NOTE — Anesthesia Postprocedure Evaluation (Signed)
Anesthesia Post Note  Patient: Amy Jacobson  Procedure(s) Performed: * No procedures listed *  Anesthesia type: Epidural  Patient location: Mother/Baby  Post pain: Pain level controlled  Post assessment: Post-op Vital signs reviewed  Last Vitals:  Filed Vitals:   02/11/13 0745  BP: 117/80  Pulse: 103  Temp: 37.1 C  Resp: 18    Post vital signs: Reviewed  Level of consciousness:alert  Complications: No apparent anesthesia complications

## 2013-02-12 NOTE — Progress Notes (Signed)
Post Partum Day 2 Subjective:  Well. Lochia are normal. Voiding, ambulating, tolerating normal diet. nursing going well. Awaiting BM from baby for discharge home  Objective: Blood pressure 110/62, pulse 84, temperature 98.5 F (36.9 C), temperature source Oral, resp. rate 22, height 5\' 2"  (1.575 m), weight 194 lb 8 oz (88.225 kg), SpO2 99.00%, unknown if currently breastfeeding.  Physical Exam:  General: normal Lochia: appropriate Uterine Fundus: 0/2 firm non-tender  Extremities: No evidence of DVT seen on physical exam. Edema minimal     Recent Labs  02/10/13 1743 02/11/13 0545  HGB 9.7* 8.6*  HCT 29.3* 25.8*    Assessment/Plan: Normal Post-partum.  Anticipate discharge today Declines contraception  Post-partum instructions reviewed    LOS: 2 days   Ohn Bostic A MD 02/12/2013, 12:40 PM

## 2013-02-13 ENCOUNTER — Ambulatory Visit: Payer: Self-pay

## 2013-02-13 NOTE — Lactation Note (Signed)
This note was copied from the chart of Amy Mazelle Huebert. Lactation Consultation Note   Follow up consult wiht this mom and baby. I assisted mom with positioning for football hold and latching. Baby latches weel, but needed bottom lip pulled out. I showed dad how to assist mom with this. Mom reports latch comfortable  Now. Baby with strontm rhthmic sucking and swallows. He has not stooled since birth(mec stained fluid), so baby has not been discharged yet.  Mom knows to call for questions/concerns  Patient Name: Amy Jacobson RUEAV'W Date: 02/13/2013 Reason for consult: Follow-up assessment   Maternal Data    Feeding Feeding Type: Breast Milk Length of feed: 8 min  LATCH Score/Interventions Latch: Grasps breast easily, tongue down, lips flanged, rhythmical sucking. (needed to pull Zeke's bottom lip out - showed dad how to hel)  Audible Swallowing: A few with stimulation Intervention(s): Hand expression  Type of Nipple: Flat (r more flat than left, evert w stimulation)  Comfort (Breast/Nipple): Soft / non-tender  Problem noted:  (sore) Interventions (Mild/moderate discomfort):  (lanolin)  Hold (Positioning): Assistance needed to correctly position infant at breast and maintain latch. Intervention(s): Skin to skin;Breastfeeding basics reviewed;Support Pillows;Position options (reviewed baby and me book pages on breast feeding)  LATCH Score: 7  Lactation Tools Discussed/Used     Consult Status Consult Status: Complete Follow-up type: Call as needed    Amy Jacobson 02/13/2013, 11:07 AM

## 2013-03-20 ENCOUNTER — Other Ambulatory Visit: Payer: Self-pay

## 2014-03-12 LAB — OB RESULTS CONSOLE GC/CHLAMYDIA
Chlamydia: NEGATIVE
GC PROBE AMP, GENITAL: NEGATIVE

## 2014-03-12 LAB — OB RESULTS CONSOLE RUBELLA ANTIBODY, IGM: Rubella: IMMUNE

## 2014-03-12 LAB — OB RESULTS CONSOLE RPR: RPR: NONREACTIVE

## 2014-03-12 LAB — OB RESULTS CONSOLE ANTIBODY SCREEN: ANTIBODY SCREEN: NEGATIVE

## 2014-03-12 LAB — OB RESULTS CONSOLE HEPATITIS B SURFACE ANTIGEN: Hepatitis B Surface Ag: NEGATIVE

## 2014-03-12 LAB — OB RESULTS CONSOLE ABO/RH: RH TYPE: POSITIVE

## 2014-03-12 LAB — OB RESULTS CONSOLE HIV ANTIBODY (ROUTINE TESTING): HIV: NONREACTIVE

## 2014-03-16 ENCOUNTER — Encounter (HOSPITAL_COMMUNITY): Payer: Self-pay | Admitting: *Deleted

## 2014-05-15 NOTE — L&D Delivery Note (Signed)
Final Labor Progress Note At the final stages of the epidural placement the pt reports an increased in rectal pressure.    Vaginal Delivery Note Spontaneous rupture of membranes today, at 1630, clear.  GBS was negative.  Cervical dilation was complete at  0140.  NICHD Category 2.    Self directed spontaneous pushing began at  0140.   After 3 minutes of pushing the head, shoulders and the body of a viable female infant "Janelle Floor" delivered spontaneously with maternal effort in the ROA position at 0143   With vigorous tone and spontaneous cry, the infant was placed on moms abd.   The cord was clamped, cut and the infant was handed to the nursing staff for immediate assessment .    Spontaneous delivery of a intact placenta with a 3 vessel cord via Shultz at  0200.   Episiotomy: None   The vulva, perineum, vaginal vault, rectum and cervix were inspected and revealed a 1 degree perineal  which was repair using a 3-0 vicryl on a CT needle and a superficial bilateral labial -  bilateral periurethral laceration which was repaired using a 4-0 vicryl on a SH needle with 20cc of 1% lidocaine.  Lidocaine was not used, the epidural was sufficient for the repair.   The rectum sphincter intact after the repair.    Postpartum pitocin as ordered.  Fundus firm, lochia minimum, bleeding under control. EBL 100, Pt hemodynamically stable.   Sponge, laps and needle count correct and verified with the primary care nurse.  Attending MD available at all times.    Routine postpartum orders   Mother unsure about method of contraception   Placenta to pathology: NO     Cord Gases sent to lab: NO Cord blood sent to lab: YES   APGARS:   8 at 1 minute and 3 at 5 minutes Weight:. pending     Both mom and baby were left in stable condition      Jonette Wassel, CNM, MSN 10/29/2014. 2:13 AM

## 2014-10-08 LAB — OB RESULTS CONSOLE GBS: GBS: NEGATIVE

## 2014-10-27 ENCOUNTER — Inpatient Hospital Stay (HOSPITAL_COMMUNITY)
Admission: AD | Admit: 2014-10-27 | Discharge: 2014-10-27 | Disposition: A | Discharge status: Home / Self Care | Payer: Medicaid Other | Source: Ambulatory Visit | Attending: Obstetrics and Gynecology | Admitting: Obstetrics and Gynecology

## 2014-10-27 ENCOUNTER — Encounter (HOSPITAL_COMMUNITY): Payer: Self-pay | Admitting: *Deleted

## 2014-10-27 DIAGNOSIS — Z3A39 39 weeks gestation of pregnancy: Secondary | ICD-10-CM | POA: Insufficient documentation

## 2014-10-27 DIAGNOSIS — Z91040 Latex allergy status: Secondary | ICD-10-CM | POA: Insufficient documentation

## 2014-10-27 DIAGNOSIS — R030 Elevated blood-pressure reading, without diagnosis of hypertension: Secondary | ICD-10-CM

## 2014-10-27 DIAGNOSIS — O9989 Other specified diseases and conditions complicating pregnancy, childbirth and the puerperium: Secondary | ICD-10-CM

## 2014-10-27 DIAGNOSIS — O479 False labor, unspecified: Secondary | ICD-10-CM

## 2014-10-27 LAB — COMPREHENSIVE METABOLIC PANEL
ALK PHOS: 151 U/L — AB (ref 38–126)
ALT: 11 U/L — AB (ref 14–54)
AST: 17 U/L (ref 15–41)
Albumin: 2.9 g/dL — ABNORMAL LOW (ref 3.5–5.0)
Anion gap: 8 (ref 5–15)
BUN: 7 mg/dL (ref 6–20)
CO2: 18 mmol/L — AB (ref 22–32)
Calcium: 8.5 mg/dL — ABNORMAL LOW (ref 8.9–10.3)
Chloride: 107 mmol/L (ref 101–111)
Creatinine, Ser: 0.51 mg/dL (ref 0.44–1.00)
GFR calc Af Amer: >60 mL/min (ref >60)
GFR calc non Af Amer: >60 mL/min (ref >60)
Glucose, Bld: 93 mg/dL (ref 65–99)
POTASSIUM: 3.6 mmol/L (ref 3.5–5.1)
Sodium: 133 mmol/L — ABNORMAL LOW (ref 135–145)
Total Bilirubin: 0.2 mg/dL — ABNORMAL LOW (ref 0.3–1.2)
Total Protein: 6.5 g/dL (ref 6.5–8.1)

## 2014-10-27 LAB — URINALYSIS, ROUTINE W REFLEX MICROSCOPIC
BILIRUBIN URINE: NEGATIVE
Glucose, UA: NEGATIVE mg/dL
HGB URINE DIPSTICK: NEGATIVE
KETONES UR: NEGATIVE mg/dL
Leukocytes, UA: NEGATIVE
Nitrite: NEGATIVE
Protein, ur: NEGATIVE mg/dL
Specific Gravity, Urine: 1.01 (ref 1.005–1.030)
Urobilinogen, UA: 0.2 mg/dL (ref 0.0–1.0)
pH: 6 (ref 5.0–8.0)

## 2014-10-27 LAB — URIC ACID: URIC ACID, SERUM: 4 mg/dL (ref 2.3–6.6)

## 2014-10-27 LAB — CBC
HCT: 33.5 % — ABNORMAL LOW (ref 36.0–46.0)
Hemoglobin: 11 g/dL — ABNORMAL LOW (ref 12.0–15.0)
MCH: 28.4 pg (ref 26.0–34.0)
MCHC: 32.8 g/dL (ref 30.0–36.0)
MCV: 86.3 fL (ref 78.0–100.0)
PLATELETS: 215 10*3/uL (ref 150–400)
RBC: 3.88 MIL/uL (ref 3.87–5.11)
RDW: 14 % (ref 11.5–15.5)
WBC: 6.8 10*3/uL (ref 4.0–10.5)

## 2014-10-27 LAB — PROTEIN / CREATININE RATIO, URINE: Creatinine, Urine: 31 mg/dL

## 2014-10-27 LAB — LACTATE DEHYDROGENASE: LDH: 137 U/L (ref 98–192)

## 2014-10-27 NOTE — MAU Provider Note (Signed)
History  29 yo G2P1 @ 39.2 wks presents to MAU unannounced due to regular ctxs since 1 am, worse since arrival. +FM. Denies h/a, visual disturbances, RUQ pain, CP, SOB, weakness, severe N/V, leaking or bleeding.   Patient Active Problem List   Diagnosis Date Noted  . Normal labor 10/28/2014  . 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 Irregular uterine contractions 10/27/2014  . DEPRESSION, MILD 10/08/2008    No chief complaint on file.  HPI As above OB History    Gravida Para Term Preterm AB TAB SAB Ectopic Multiple Living   _0 Past Medical History  Diagnosis Date  . Medical history non-contributory     Past Surgical History  Procedure Laterality Date  . No past surgeries      Family History  Problem Relation Age of Onset  . Diabetes Father     History  Substance Use Topics  . Smoking status: Never Smoker   . Smokeless tobacco: Not on file  . Alcohol Use: No    Allergies:  Allergies  Allergen Reactions  . Benadryl [Diphenhydramine] Anaphylaxis  . Latex Itching  . Night-Time Cough [Doxylamine-Dm] Palpitations    Prescriptions prior to admission  Medication Sig Dispense Refill Last Dose  . Prenatal Vit-Fe Fumarate-FA (PRENATAL MULTIVITAMIN) TABS tablet Take 1 tablet by mouth daily at 12 noon.   10/28/2014 at Unknown time    ROS  As stated in history Physical Exam  BP range 125-148/81-91  Results for orders placed or performed during the hospital encounter of 10/27/14 (from the past 48 hour(s))  Urinalysis, Routine w reflex microscopic (not at [facility])     Status: None   Collection Time: 10/27/14  3:10 AM  Result Value Ref Range   Color, Urine YELLOW YELLOW   APPearance CLEAR CLEAR   Specific Gravity, Urine 1.010 1.005 - 1.030   pH 6.0 5.0 - 8.0   Glucose, UA NEGATIVE NEGATIVE mg/dL   Hgb urine dipstick NEGATIVE NEGATIVE   Bilirubin Urine NEGATIVE NEGATIVE    Ketones, ur NEGATIVE NEGATIVE mg/dL   Protein, ur NEGATIVE NEGATIVE mg/dL   Urobilinogen, UA 0.2 0.0 - 1.0 mg/dL   Nitrite NEGATIVE NEGATIVE   Leukocytes, UA NEGATIVE NEGATIVE    Comment: MICROSCOPIC NOT DONE ON URINES WITH NEGATIVE PROTEIN, BLOOD, LEUKOCYTES, NITRITE, OR GLUCOSE <1000 mg/dL.  Protein / creatinine ratio, urine     Status: None   Collection Time: 10/27/14  3:10 AM  Result Value Ref Range   Creatinine, Urine 31.00 mg/dL   Total Protein, Urine <6 mg/dL    Comment: REPEATED TO VERIFY NO NORMAL RANGE ESTABLISHED FOR THIS TEST    Protein Creatinine Ratio        0.00 - 0.15 mg/mg[Cre]    Comment: RESULT BELOW REPORTABLE RANGE, UNABLE TO CALCULATE.   CBC     Status: Abnormal   Collection Time: 10/27/14  3:27 AM  Result Value Ref Range   WBC 6.8 4.0 - 10.5 K/uL   RBC 3.88 3.87 - 5.11 MIL/uL   Hemoglobin 11.0 (L) 12.0 - 15.0 g/dL   HCT 33.5 (L) 36.0 - 46.0 %   MCV 86.3 78.0 - 100.0 fL   MCH 28.4 26.0 - 34.0 pg   MCHC 32.8 30.0 - 36.0 g/dL   RDW 14.0 11.5 - 15.5 %   Platelets 215 150 - 400 K/uL  Comprehensive  metabolic panel     Status: Abnormal   Collection Time: 10/27/14  3:27 AM  Result Value Ref Range   Sodium 133 (L) 135 - 145 mmol/L   Potassium 3.6 3.5 - 5.1 mmol/L   Chloride 107 101 - 111 mmol/L   CO2 18 (L) 22 - 32 mmol/L   Glucose, Bld 93 65 - 99 mg/dL   BUN 7 6 - 20 mg/dL   Creatinine, Ser 0.51 0.44 - 1.00 mg/dL   Calcium 8.5 (L) 8.9 - 10.3 mg/dL   Total Protein 6.5 6.5 - 8.1 g/dL   Albumin 2.9 (L) 3.5 - 5.0 g/dL   AST 17 15 - 41 U/L   ALT 11 (L) 14 - 54 U/L   Alkaline Phosphatase 151 (H) 38 - 126 U/L   Total Bilirubin 0.2 (L) 0.3 - 1.2 mg/dL   GFR calc non Af Amer >60 >60 mL/min   GFR calc Af Amer >60 >60 mL/min    Comment: (NOTE) The eGFR has been calculated using the CKD EPI equation. This calculation has not been validated in all clinical situations. eGFR's persistently <60 mL/min signify possible Chronic Kidney Disease.    Anion gap 8 5 -  15  Uric acid     Status: None   Collection Time: 10/27/14  3:27 AM  Result Value Ref Range   Uric Acid, Serum 4.0 2.3 - 6.6 mg/dL  Lactate dehydrogenase     Status: None   Collection Time: 10/27/14  3:27 AM  Result Value Ref Range   LDH 137 98 - 192 U/L   Physical Exam Gen: NAD Lungs: CTAB CV: RRR w/o M/R/G Abdomen: gravid, NT, no guarding or rebound Pelvic: 2 cm per MAU RN FHRT: Cat 1 FHRT U/C's: Irregular ED Course  Assessment: Elevated BPs. Normal preeclampsia labs. + contractions, but not in labor. Discussed with Dr. Stringer.  Plan: Pt's presentation does not meet criteria for preeclampsia. D/C home w/ strict labor precautions and office f/u as scheduled.   WILLIAMS, KIMBERLY CNM, MS 10/27/14, 04:32 AM  

## 2014-10-27 NOTE — MAU Note (Signed)
PT  SAYS SHE STARTED  HURTING  BAD   SINCE 1250AM  . VE IN OFFICE  ON  Thursday    2 CM.  DENIES HSV AND  MRSA.  GBS- NEG

## 2014-10-28 ENCOUNTER — Inpatient Hospital Stay (HOSPITAL_COMMUNITY)
Admission: AD | Admit: 2014-10-28 | Discharge: 2014-10-30 | DRG: 775 | Disposition: A | Discharge status: Home / Self Care | Payer: Medicaid Other | Source: Ambulatory Visit | Attending: Obstetrics and Gynecology | Admitting: Obstetrics and Gynecology

## 2014-10-28 ENCOUNTER — Encounter (HOSPITAL_COMMUNITY): Payer: Self-pay

## 2014-10-28 DIAGNOSIS — Z889 Allergy status to unspecified drugs, medicaments and biological substances status: Secondary | ICD-10-CM

## 2014-10-28 DIAGNOSIS — Z91040 Latex allergy status: Secondary | ICD-10-CM | POA: Diagnosis present

## 2014-10-28 DIAGNOSIS — Z141 Cystic fibrosis carrier: Secondary | ICD-10-CM

## 2014-10-28 DIAGNOSIS — Z3A39 39 weeks gestation of pregnancy: Secondary | ICD-10-CM | POA: Diagnosis present

## 2014-10-28 DIAGNOSIS — Z3483 Encounter for supervision of other normal pregnancy, third trimester: Secondary | ICD-10-CM | POA: Diagnosis present

## 2014-10-28 HISTORY — DX: Other specified health status: Z78.9

## 2014-10-28 LAB — CBC
HCT: 33.5 % — ABNORMAL LOW (ref 36.0–46.0)
Hemoglobin: 11.4 g/dL — ABNORMAL LOW (ref 12.0–15.0)
MCH: 29.7 pg (ref 26.0–34.0)
MCHC: 34 g/dL (ref 30.0–36.0)
MCV: 87.2 fL (ref 78.0–100.0)
PLATELETS: 252 10*3/uL (ref 150–400)
RBC: 3.84 MIL/uL — ABNORMAL LOW (ref 3.87–5.11)
RDW: 14.5 % (ref 11.5–15.5)
WBC: 8.5 10*3/uL (ref 4.0–10.5)

## 2014-10-28 LAB — TYPE AND SCREEN
ABO/RH(D): A POS
Antibody Screen: NEGATIVE

## 2014-10-28 MED ORDER — DIPHENHYDRAMINE HCL 50 MG/ML IJ SOLN: 12.5000 mg | INTRAMUSCULAR | Status: DC | PRN

## 2014-10-28 MED ORDER — SODIUM CHLORIDE 0.9 % IV SOLN: 250.0000 mL | INTRAVENOUS | Status: DC | PRN

## 2014-10-28 MED ORDER — CITRIC ACID-SODIUM CITRATE 334-500 MG/5ML PO SOLN: 30.0000 mL | ORAL | Status: DC | PRN

## 2014-10-28 MED ORDER — SODIUM CHLORIDE 0.9 % IJ SOLN: 3.0000 mL | INTRAMUSCULAR | Status: DC | PRN

## 2014-10-28 MED ORDER — ACETAMINOPHEN 325 MG PO TABS: 650.0000 mg | ORAL_TABLET | ORAL | Status: DC | PRN

## 2014-10-28 MED ORDER — EPHEDRINE 5 MG/ML INJ
10.0000 mg | INTRAVENOUS | Status: DC | PRN
  Filled 2014-10-28: qty 2

## 2014-10-28 MED ORDER — SODIUM CHLORIDE 0.9 % IJ SOLN: 3.0000 mL | Freq: Two times a day (BID) | INTRAMUSCULAR | Status: DC

## 2014-10-28 MED ORDER — OXYTOCIN BOLUS FROM INFUSION
500.0000 mL | INTRAVENOUS | Status: DC
Start: 2014-10-28 — End: 2014-10-29
  Administered 2014-10-29: 500 mL via INTRAVENOUS

## 2014-10-28 MED ORDER — OXYCODONE-ACETAMINOPHEN 5-325 MG PO TABS: 2.0000 | ORAL_TABLET | ORAL | Status: DC | PRN

## 2014-10-28 MED ORDER — LIDOCAINE HCL (PF) 1 % IJ SOLN
30.0000 mL | INTRAMUSCULAR | Status: DC | PRN
  Administered 2014-10-29: 30 mL via SUBCUTANEOUS
  Filled 2014-10-28: qty 30

## 2014-10-28 MED ORDER — LACTATED RINGERS IV SOLN: 500.0000 mL | INTRAVENOUS | Status: DC | PRN

## 2014-10-28 MED ORDER — BUTORPHANOL TARTRATE 1 MG/ML IJ SOLN
1.0000 mg | INTRAMUSCULAR | Status: DC | PRN
  Filled 2014-10-28: qty 1

## 2014-10-28 MED ORDER — ONDANSETRON HCL 4 MG/2ML IJ SOLN: 4.0000 mg | Freq: Four times a day (QID) | INTRAMUSCULAR | Status: DC | PRN

## 2014-10-28 MED ORDER — OXYCODONE-ACETAMINOPHEN 5-325 MG PO TABS
1.0000 | ORAL_TABLET | ORAL | Status: DC | PRN
Start: 2014-10-28 — End: 2014-10-29

## 2014-10-28 MED ORDER — FENTANYL 2.5 MCG/ML BUPIVACAINE 1/10 % EPIDURAL INFUSION (WH - ANES)
14.0000 mL/h | INTRAMUSCULAR | Status: DC | PRN
  Administered 2014-10-29: 14 mL/h via EPIDURAL
  Filled 2014-10-28: qty 125

## 2014-10-28 MED ORDER — MISOPROSTOL 200 MCG PO TABS
50.0000 ug | ORAL_TABLET | ORAL | Status: DC
  Administered 2014-10-28: 50 ug via ORAL
  Filled 2014-10-28: qty 0.5

## 2014-10-28 MED ORDER — LACTATED RINGERS IV SOLN: INTRAVENOUS | Status: DC

## 2014-10-28 MED ORDER — FLEET ENEMA 7-19 GM/118ML RE ENEM: 1.0000 | ENEMA | RECTAL | Status: DC | PRN

## 2014-10-28 MED ORDER — PHENYLEPHRINE 40 MCG/ML (10ML) SYRINGE FOR IV PUSH (FOR BLOOD PRESSURE SUPPORT)
80.0000 ug | PREFILLED_SYRINGE | INTRAVENOUS | Status: DC | PRN
  Filled 2014-10-28: qty 2
  Filled 2014-10-28: qty 20

## 2014-10-28 MED ORDER — OXYTOCIN 40 UNITS IN LACTATED RINGERS INFUSION - SIMPLE MED
62.5000 mL/h | INTRAVENOUS | Status: DC
  Administered 2014-10-29: 62.5 mL/h via INTRAVENOUS
  Filled 2014-10-28: qty 1000

## 2014-10-28 NOTE — H&P (Signed)
Amy Jacobson is a 29 y.o. female, G2P1001 at 78 3/7 weeks, presenting for SROM at 4:30pm, mild UCs since.  Denies bleeding, reports +FM.  Seen in MAU and at office yesterday, with cervix 2 cm.  Membranes swept at office.    Planning waterbirth--signed consents at office.  Patient Active Problem List   Diagnosis Date Noted  . Normal labor 10/28/2014  . 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 Irregular uterine contractions 10/27/2014  . DEPRESSION, MILD 10/08/2008    History of present pregnancy: Patient entered care at 10 3/7 weeks.   EDC of 11/01/14 was established by LMP.   Anatomy scan:  18 4/7 weeks, with limited anatomy and an anterior placenta.   Additional [place] evaluations:   22 3/7 weeks:  Completion of anatomy. Significant prenatal events:  Treated for sinus infection at 33 weeks.  Declined TDAP and flu.  Signed waterbirth consents and attended class. Last evaluation:  10/27/14--Office and MAU, cervix 2 cm.  OB History    Gravida Para Term Preterm AB TAB SAB Ectopic Multiple Living   2 1 1       1     2014--[facility], 40 1/7 weeks, 13 hour labor, 1 h 25 min 2nd stage, female, 7+13, utilized WB tub for labor, then elected epidural due to prolonged active phase, Dr. Pennie Rushing  Past Medical History  Diagnosis Date  . Medical history non-contributory      Past Surgical History  Procedure Laterality Date  . No past surgeries       Family History: family history includes Diabetes in her father.   Social History:  reports that she has never smoked. She does not have any smokeless tobacco history on file. She reports that she does not drink alcohol or use illicit drugs.  Patient is Caucasian, of the [place] faith, married to FOB, [place].  Has 2 years of college, is a [employer] ([place]).   Prenatal Transfer Tool  Maternal Diabetes: No Genetic Screening: Declined Maternal Ultrasounds/Referrals: Normal Fetal  Ultrasounds or other Referrals:  None Maternal Substance Abuse:  No Significant Maternal Medications:  None Significant Maternal Lab Results: Lab values include: Group B Strep negative, Other: CF carrier, FOB negative.  TDAP declined Flu declined  ROS:  Leaking clear fluid, mild UCs, +FM  Allergies  Allergen Reactions  . Benadryl [Diphenhydramine] Anaphylaxis  . Latex Itching  . Night-Time Cough [Doxylamine-Dm] Palpitations    Dilation: 2.5 Effacement (%): 70 Station: -1 Exam by:: VEmilee Hero CNM Blood pressure 140/77, pulse 94, temperature 98.5 F (36.9 C), temperature source Oral, resp. rate 18, height 5\' 2"  (1.575 m), weight 80.287 kg (177 lb), unknown if currently breastfeeding.  Chest clear Heart RRR without murmur Abd gravid, NT, FH 39 cm Pelvic: As above--leaking clear fluid, cervix posterior Ext: DTR 2+, no clonus, no edema  FHR: Category 1 UCs:  q 4-6 min, mild.  Prenatal labs: ABO, Rh:  A+ Antibody:  Neg Rubella:   Innume RPR:   NR HBsAg:   Neb HIV:   NR GBS:  Neg 10/06/14 Sickle cell/Hgb electrophoresis:  NA Pap:  2013 GC:  Negative 03/12/14, 10/08/14 Chlamydia:  Negative 03/12/14, 10/08/14 Genetic screenings:  Declined Glucola:  WNL Other:   Hgb 13.5 at NOB, 12 at 28 weeks Urine culture 100K enterococcus and Ecoli 03/12/14      Assessment/Plan: IUP at 39 3/7 weeks SROM at 4:30p GBS negative Planning waterbirth  Plan: Admit to [facility]  per consult with Dr. Estanislado Pandy Routine [facility] orders OK for waterbirth--consents on chart Pain med/epidural prn Continue to observe for advancement of labor progress--if no increase by 8:30-9p, recommend po Cytotech if appropriate candidate. OK for light diet.  Nikolette Reindl, VICKICNM, [place] 10/28/2014, 6:30p

## 2014-10-28 NOTE — Progress Notes (Signed)
Labor Progress  Subjective: Starting to feel the large ctx.  Pt desires a water birth.  Willing to try cytotec per Chip Boer suggestion vs pitocin  Objective: BP 134/78 mmHg  Pulse 94  Temp(Src) 99.3 F (37.4 C) (Oral)  Resp 20  Ht 5\' 2"  (1.575 m)  Wt 177 lb (80.287 kg)  BMI 32.37 kg/m2     FHT:  13 5, moderate variability, + accel, no decel CTX:  irregular, every 2-10 minutes Uterus gravid, soft non tender SVE:  Dilation: 2.5 Effacement (%): 70 Station: 0 Exam by:: Tasman Zapata CNM    Assessment:  IUP at 39.3 weeks NICHD: Category 1 Membranes:  SROM x 4hrs, no s/s of infection Labor progress: Inadquate labor  GBS: negative   Plan: Continue labor plan intermittent monitoring Ambulate Frequent position changes to facilitate fetal rotation and descent. Will reassess with cervical exam at 1230 or earlier if necessary cytotec po q4 PRN     Amy Jacobson, CNM, MSN 10/28/2014. 9:10 PM

## 2014-10-28 NOTE — MAU Note (Signed)
Water broke about one hour ago, occasional contractions, fluid clear.

## 2014-10-29 ENCOUNTER — Inpatient Hospital Stay (HOSPITAL_COMMUNITY): Payer: Medicaid Other | Admitting: Anesthesiology

## 2014-10-29 ENCOUNTER — Encounter (HOSPITAL_COMMUNITY): Payer: Self-pay | Admitting: Anesthesiology

## 2014-10-29 LAB — HIV ANTIBODY (ROUTINE TESTING W REFLEX): HIV SCREEN 4TH GENERATION: NONREACTIVE

## 2014-10-29 LAB — RPR: RPR Ser Ql: NONREACTIVE

## 2014-10-29 MED ORDER — OXYCODONE-ACETAMINOPHEN 5-325 MG PO TABS: 1.0000 | ORAL_TABLET | ORAL | Status: DC | PRN

## 2014-10-29 MED ORDER — OXYCODONE-ACETAMINOPHEN 5-325 MG PO TABS: 2.0000 | ORAL_TABLET | ORAL | Status: DC | PRN

## 2014-10-29 MED ORDER — ONDANSETRON HCL 4 MG PO TABS: 4.0000 mg | ORAL_TABLET | ORAL | Status: DC | PRN

## 2014-10-29 MED ORDER — SENNOSIDES-DOCUSATE SODIUM 8.6-50 MG PO TABS
2.0000 | ORAL_TABLET | ORAL | Status: DC
  Administered 2014-10-30: 2 via ORAL
  Filled 2014-10-29: qty 2

## 2014-10-29 MED ORDER — DIBUCAINE 1 % RE OINT: 1.0000 "application " | TOPICAL_OINTMENT | RECTAL | Status: DC | PRN

## 2014-10-29 MED ORDER — LANOLIN HYDROUS EX OINT: TOPICAL_OINTMENT | CUTANEOUS | Status: DC | PRN

## 2014-10-29 MED ORDER — IBUPROFEN 600 MG PO TABS
600.0000 mg | ORAL_TABLET | Freq: Four times a day (QID) | ORAL | Status: DC
  Administered 2014-10-29 – 2014-10-30 (×6): 600 mg via ORAL
  Filled 2014-10-29 (×6): qty 1

## 2014-10-29 MED ORDER — FENTANYL 2.5 MCG/ML BUPIVACAINE 1/10 % EPIDURAL INFUSION (WH - ANES): 12.0000 mL/h | INTRAMUSCULAR | Status: DC | PRN

## 2014-10-29 MED ORDER — PHENYLEPHRINE 40 MCG/ML (10ML) SYRINGE FOR IV PUSH (FOR BLOOD PRESSURE SUPPORT): 80.0000 ug | PREFILLED_SYRINGE | INTRAVENOUS | Status: DC | PRN

## 2014-10-29 MED ORDER — ZOLPIDEM TARTRATE 5 MG PO TABS: 5.0000 mg | ORAL_TABLET | Freq: Every evening | ORAL | Status: DC | PRN

## 2014-10-29 MED ORDER — TETANUS-DIPHTH-ACELL PERTUSSIS 5-2.5-18.5 LF-MCG/0.5 IM SUSP: 0.5000 mL | Freq: Once | INTRAMUSCULAR | Status: DC

## 2014-10-29 MED ORDER — BENZOCAINE-MENTHOL 20-0.5 % EX AERO
1.0000 "application " | INHALATION_SPRAY | CUTANEOUS | Status: DC | PRN
  Administered 2014-10-29: 1 via TOPICAL
  Filled 2014-10-29: qty 56

## 2014-10-29 MED ORDER — ONDANSETRON HCL 4 MG/2ML IJ SOLN: 4.0000 mg | INTRAMUSCULAR | Status: DC | PRN

## 2014-10-29 MED ORDER — PRENATAL MULTIVITAMIN CH
1.0000 | ORAL_TABLET | Freq: Every day | ORAL | Status: DC
  Administered 2014-10-29 – 2014-10-30 (×2): 1 via ORAL
  Filled 2014-10-29 (×2): qty 1

## 2014-10-29 MED ORDER — SIMETHICONE 80 MG PO CHEW: 80.0000 mg | CHEWABLE_TABLET | ORAL | Status: DC | PRN

## 2014-10-29 MED ORDER — WITCH HAZEL-GLYCERIN EX PADS: 1.0000 "application " | MEDICATED_PAD | CUTANEOUS | Status: DC | PRN

## 2014-10-29 MED ORDER — LIDOCAINE HCL (PF) 1 % IJ SOLN
INTRAMUSCULAR | Status: DC | PRN
  Administered 2014-10-29: 3 mL
  Administered 2014-10-29: 4 mL

## 2014-10-29 MED ORDER — ACETAMINOPHEN 325 MG PO TABS: 650.0000 mg | ORAL_TABLET | ORAL | Status: DC | PRN

## 2014-10-29 NOTE — Anesthesia Postprocedure Evaluation (Signed)
  Anesthesia Post-op Note  Patient: Amy Jacobson  Procedure(s) Performed: * No procedures listed *  Patient Location: Mother/Baby  Anesthesia Type:Epidural  Level of Consciousness: awake, alert , oriented and patient cooperative  Airway and Oxygen Therapy: Patient Spontanous Breathing  Post-op Pain: none  Post-op Assessment: Post-op Vital signs reviewed, Patient's Cardiovascular Status Stable, Respiratory Function Stable, Patent Airway, No headache, No backache and Patient able to bend at knees              Post-op Vital Signs: Reviewed and stable  Last Vitals:  Filed Vitals:   10/29/14 0500  BP: 112/70  Pulse: 84  Temp: 36.9 C  Resp: 18    Complications: No apparent anesthesia complications

## 2014-10-29 NOTE — Anesthesia Procedure Notes (Addendum)
Epidural Patient location during procedure: OB Start time: 10/29/2014 1:38 AM  Staffing Anesthesiologist: Mal Amabile Performed by: anesthesiologist   Preanesthetic Checklist Completed: patient identified, site marked, surgical consent, pre-op evaluation, timeout performed, IV checked, risks and benefits discussed and monitors and equipment checked  Epidural Patient position: sitting Prep: site prepped and draped and DuraPrep Patient monitoring: continuous pulse ox and blood pressure Approach: midline Location: L4-L5 Injection technique: LOR air  Needle:  Needle type: Tuohy  Needle gauge: 17 G Needle length: 9 cm and 9 Needle insertion depth: 4 cm Catheter type: closed end flexible Catheter size: 19 Gauge Catheter at skin depth: 9 cm Test dose: negative and Other  Assessment Events: blood not aspirated, injection not painful, no injection resistance, negative IV test and no paresthesia  Additional Notes Patient identified. Risks and benefits discussed including failed block, incomplete  Pain control, post dural puncture headache, nerve damage, paralysis, blood pressure Changes, nausea, vomiting, reactions to medications-both toxic and allergic and post Partum back pain. All questions were answered. Patient expressed understanding and wished to proceed. Sterile technique was used throughout procedure. Epidural site was Dressed with sterile barrier dressing. No paresthesias, signs of intravascular injection Or signs of intrathecal spread were encountered.  Patient was more comfortable after the epidural was dosed. Please see RN's note for documentation of vital signs and FHR which are stable.

## 2014-10-29 NOTE — Progress Notes (Signed)
Subjective: Postpartum Day 0: Vaginal delivery, 1st degree perineal, superficial bilateral labial, and bilateral periurethral lacerations Patient up ad lib, reports no syncope or dizziness. Feeding:  Breast Contraceptive plan:  Undecided  Objective: Vital signs in last 24 hours: Temp:  [98.2 F (36.8 C)-99.3 F (37.4 C)] 98.9 F (37.2 C) (06/16 0945) Pulse Rate:  [83-120] 83 (06/16 0945) Resp:  [18-22] 18 (06/16 0945) BP: (112-150)/(56-104) 117/70 mmHg (06/16 0945) SpO2:  [98 %] 98 % (06/16 0500) Weight:  [80.287 kg (177 lb)] 80.287 kg (177 lb) (06/15 1802)  Physical Exam:  General: alert Lochia: appropriate Uterine Fundus: firm Perineum: healing well DVT Evaluation: No evidence of DVT seen on physical exam. Negative Homan's sign.  CBC Latest Ref Rng 10/28/2014 10/27/2014 02/11/2013  WBC 4.0 - 10.5 K/uL 8.5 6.8 9.1  Hemoglobin 12.0 - 15.0 g/dL 11.4(L) 11.0(L) 8.6(L)  Hematocrit 36.0 - 46.0 % 33.5(L) 33.5(L) 25.8(L)  Platelets 150 - 400 K/uL 252 215 180     Assessment/Plan: Status post vaginal delivery day 0. Stable Continue current care. May want d/c tomorrow    Nyra Capes 10/29/2014, 10:09 AM

## 2014-10-29 NOTE — Progress Notes (Signed)
MOB was referred for history of depression/anxiety. * Referral screened out by Clinical Social Worker because none of the following criteria appear to apply: ~ History of anxiety/depression during this pregnancy, or of post-partum depression. ~ Diagnosis of anxiety and/or depression within last 3 years OR * MOB's symptoms currently being treated with medication and/or therapy. Please contact the Clinical Social Worker if needs arise, or if MOB requests.   

## 2014-10-29 NOTE — Anesthesia Preprocedure Evaluation (Signed)
Anesthesia Evaluation  Patient identified by MRN, date of birth, ID band Patient awake    Reviewed: Allergy & Precautions, Patient's Chart, lab work & pertinent test results  Airway Mallampati: III  TM Distance: >3 FB Neck ROM: Full    Dental no notable dental hx. (+) Teeth Intact   Pulmonary neg pulmonary ROS,  breath sounds clear to auscultation  Pulmonary exam normal       Cardiovascular Normal cardiovascular examRhythm:Regular Rate:Normal     Neuro/Psych PSYCHIATRIC DISORDERS Depression negative neurological ROS     GI/Hepatic negative GI ROS, Neg liver ROS,   Endo/Other  obesity  Renal/GU negative Renal ROS  negative genitourinary   Musculoskeletal negative musculoskeletal ROS (+)   Abdominal (+) + obese,   Peds  Hematology  (+) anemia ,   Anesthesia Other Findings   Reproductive/Obstetrics (+) Pregnancy                             Anesthesia Physical Anesthesia Plan  ASA: II  Anesthesia Plan: Epidural   Post-op Pain Management:    Induction:   Airway Management Planned: Natural Airway  Additional Equipment:   Intra-op Plan:   Post-operative Plan:   Informed Consent: I have reviewed the patients History and Physical, chart, labs and discussed the procedure including the risks, benefits and alternatives for the proposed anesthesia with the patient or authorized representative who has indicated his/her understanding and acceptance.     Plan Discussed with: Anesthesiologist  Anesthesia Plan Comments:         Anesthesia Quick Evaluation

## 2014-10-29 NOTE — Lactation Note (Signed)
This note was copied from the chart of Amy Daleyza Alles. Lactation Consultation Note  Patient Name: Amy Jacobson [id number] Date: 10/29/2014 Reason for consult: Initial assessment   Initial consult at 20 hours old; infant GA 39.4; BW 7 lbs, 1.9 oz.  Mom is a P2 and has 71 months old she is still breastfeeding at home.  Infant has breastfed x8 (15-40 min) + attempt x1 (5 min); voids-2 stools-2. Mom noted infant has an ULT; LC assessed and upper lip frenulum comes to tip of gum line; unable to assess sublingual frenulum - infant would not open mouth for LC to check and could not get fingers under tongue to assess. While charting in room, mom independently latched infant to left side in cradle hold.  LS-9.  Infant fed with rhythmical sucking and mom assured flanging of lips.   Encouraged to continue feeding with cues, educated on size of infant's stomach, and cluster feeding. Parents attended breastfeeding classes 2 yrs ago and remembered LC when LC entered room.   Lactation discharge teaching - educated on engorgement prevention.  Mom has own personal DEBP at home.  C-gels given as requested by mom.   Lactation brochure given and informed of hopsital support group and outpatient services.      Maternal Data Formula Feeding for Exclusion: No Has patient been taught Hand Expression?: Yes (pt knows how to hand express her milk )  Feeding Feeding Type: Breast Fed Length of feed: 15 min  LATCH Score/Interventions                      Lactation Tools Discussed/Used WIC Program: No   Consult Status Consult Status: PRN    Lendon Ka 10/29/2014, 10:32 PM

## 2014-10-29 NOTE — Progress Notes (Signed)
Labor Progress  Subjective: Unable to manage the pain and is requesting an epidural  Objective: BP 119/80 mmHg  Pulse 88  Temp(Src) 98.2 F (36.8 C) (Oral)  Resp 20  Ht 5\' 2"  (1.575 m)  Wt 177 lb (80.287 kg)  BMI 32.37 kg/m2     FHT:  135, moderate variability, + accel, occasional variable decels CTX:  regular, every 2-3 minutes Uterus gravid, soft non tender SVE:  Dilation: 4.5 Effacement (%): 90 Station: 0 Exam by:: Raliegh Ip RN   Assessment:  IUP at 39.4 weeks NICHD: Category 1  Membranes:  SROM  x 8hrs, no s/s of infection Labor progress: adquate labor GBS: negative   Plan: Continue labor plan Continuous monitoring Epidural per pt request Continue pitocin per protocol     Brigg Cape, CNM, MSN 10/29/2014. 1:41 AM

## 2014-10-30 LAB — CBC
HCT: 33.1 % — ABNORMAL LOW (ref 36.0–46.0)
HEMOGLOBIN: 10.7 g/dL — AB (ref 12.0–15.0)
MCH: 28.1 pg (ref 26.0–34.0)
MCHC: 32.3 g/dL (ref 30.0–36.0)
MCV: 86.9 fL (ref 78.0–100.0)
Platelets: 220 10*3/uL (ref 150–400)
RBC: 3.81 MIL/uL — ABNORMAL LOW (ref 3.87–5.11)
RDW: 14.5 % (ref 11.5–15.5)
WBC: 8 10*3/uL (ref 4.0–10.5)

## 2014-10-30 MED ORDER — IBUPROFEN 600 MG PO TABS
600.0000 mg | ORAL_TABLET | Freq: Four times a day (QID) | ORAL | Status: DC
Start: 2014-10-30 — End: 2016-07-03

## 2014-10-30 MED ORDER — OXYCODONE-ACETAMINOPHEN 5-325 MG PO TABS: 1.0000 | ORAL_TABLET | ORAL | Status: DC | PRN

## 2014-10-30 MED ORDER — FERROUS SULFATE 325 (65 FE) MG PO TBEC: 325.0000 mg | DELAYED_RELEASE_TABLET | Freq: Two times a day (BID) | ORAL | Status: AC

## 2014-10-30 NOTE — Discharge Summary (Signed)
Vaginal Delivery Discharge Summary  ALL information will be verified prior to discharge  Amy Jacobson  DOB:    25-Dec-1985 MRN:    [id number] CSN:    [id number]  Date of admission:                  10/28/14  Date of discharge:                   10/30/14  Procedures this admission: SVD  Date of Delivery: 10/29/14  Newborn Data:  Live born  Information for the patient's newborn:  Alverda, Nazzaro Girl Lexy [[id number]]  female   Live born female  Birth Weight: 7 lb 1.9 oz (3230 g) APGAR: 8, 9  Home with mother. Name: [name]   History of Present Illness: Ms. Amy Jacobson is a 29 y.o. female, [id number], who presents at [redacted]w[redacted]d weeks gestation. The patient has been followed at the [facility] division of [facility]. She was admitted rupture of membranes. Her pregnancy has been complicated by:  Patient Active Problem List   Diagnosis Date Noted  . Vaginal delivery 10/29/2014  . 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 Irregular uterine contractions 10/27/2014  . DEPRESSION, MILD 10/08/2008    Hospital course: The patient was admitted for SROM.   Her labor was not complicated. She proceeded to have a vaginal delivery of a healthy infant. Her delivery was not complicated. Her postpartum course was not complicated. She was discharged to home on postpartum day 1 doing well.  Feeding: breast  Contraception: no method   Discharge hemoglobin: HEMOGLOBIN  Date Value Ref Range Status  10/30/2014 10.7* 12.0 - 15.0 g/dL Final   HCT  Date Value Ref Range Status  10/30/2014 33.1* 36.0 - 46.0 % Final    PreNatal Labs ABO, Rh: --/--/A POS (06/15 2035)   Antibody: NEG (06/15 2035) Rubella:    immune RPR: Non Reactive (06/15 2035)  HBsAg: Negative (10/29 0000)  HIV: Non-reactive (10/29 0000)  GBS: Negative (05/26 0000)  Discharge Physical Exam:  General:  alert and cooperative Lochia: appropriate Uterine Fundus: firm Incision: healing well DVT Evaluation: No evidence of DVT seen on physical exam.  Intrapartum Procedures: spontaneous vaginal delivery Postpartum Procedures: none Complications-Operative and Postpartum: 1 degree perineal laceration  Discharge Diagnoses: Term Pregnancy-delivered,  asymptomatic anemia  Activity:           pelvic rest Diet:                routine Medications: PNV, Ibuprofen, Iron and Percocet Condition:      stable     Postpartum Teaching: Nutrition, exercise, return to work or school, family visits, sexual activity, home rest, vaginal bleeding, pelvic rest, family planning, s/s of PPD, breast care peri-care and incision care   Discharge to: home  Follow-up Information    Follow up with [facility] In 6 weeks.   Specialty:  Obstetrics and Gynecology   Why:  Postpartum check up   Contact information:   [address] [place] [address] [phone]       Misako Roeder, CNM, MSN 10/30/2014. 10:28 AM  All information will be verified prior to discharge   Postpartum Care After Vaginal Delivery  After you deliver your newborn (postpartum period), the usual stay in the hospital is 24 72 hours. If there were problems with your labor or delivery,  or if you have other medical problems, you might be in the hospital longer.  While you are in the hospital, you will receive help and instructions on how to care for yourself and your newborn during the postpartum period.  While you are in the hospital:  Be sure to tell your nurses if you have pain or discomfort, as well as where you feel the pain and what makes the pain worse.  If you had an incision made near your vagina (episiotomy) or if you had some tearing during delivery, the nurses may put ice packs on your episiotomy or tear. The ice packs may help to reduce the pain and swelling.  If  you are breastfeeding, you may feel uncomfortable contractions of your uterus for a couple of weeks. This is normal. The contractions help your uterus get back to normal size.  It is normal to have some bleeding after delivery.  For the first 1 3 days after delivery, the flow is red and the amount may be similar to a period.  It is common for the flow to start and stop.  In the first few days, you may pass some small clots. Let your nurses know if you begin to pass large clots or your flow increases.  Do not  flush blood clots down the toilet before having the nurse look at them.  During the next 3 10 days after delivery, your flow should become more watery and pink or brown-tinged in color.  Ten to fourteen days after delivery, your flow should be a small amount of yellowish-white discharge.  The amount of your flow will decrease over the first few weeks after delivery. Your flow may stop in 6 8 weeks. Most women have had their flow stop by 12 weeks after delivery.  You should change your sanitary pads frequently.  Wash your hands thoroughly with soap and water for at least 20 seconds after changing pads, using the toilet, or before holding or feeding your newborn.  You should feel like you need to empty your bladder within the first 6 8 hours after delivery.  In case you become weak, lightheaded, or faint, call your nurse before you get out of bed for the first time and before you take a shower for the first time.  Within the first few days after delivery, your breasts may begin to feel tender and full. This is called engorgement. Breast tenderness usually goes away within 48 72 hours after engorgement occurs. You may also notice milk leaking from your breasts. If you are not breastfeeding, do not stimulate your breasts. Breast stimulation can make your breasts produce more milk.  Spending as much time as possible with your newborn is very important. During this time, you and your  newborn can feel close and get to know each other. Having your newborn stay in your room (rooming in) will help to strengthen the bond with your newborn. It will give you time to get to know your newborn and become comfortable caring for your newborn.  Your hormones change after delivery. Sometimes the hormone changes can temporarily cause you to feel sad or tearful. These feelings should not last more than a few days. If these feelings last longer than that, you should talk to your caregiver.  If desired, talk to your caregiver about methods of family planning or contraception.  Talk to your caregiver about immunizations. Your caregiver may want you to have the following immunizations before leaving the hospital:  Tetanus, diphtheria,  and pertussis (Tdap) or tetanus and diphtheria (Td) immunization. It is very important that you and your family (including grandparents) or others caring for your newborn are up-to-date with the Tdap or Td immunizations. The Tdap or Td immunization can help protect your newborn from getting ill.  Rubella immunization.  Varicella (chickenpox) immunization.  Influenza immunization. You should receive this annual immunization if you did not receive the immunization during your pregnancy. Document Released: 02/26/2007 Document Revised: 01/24/2012 Document Reviewed: 12/27/2011 [facility] Patient Information 2014 [place], [place].   Postpartum Depression and Baby Blues  The postpartum period begins right after the birth of a baby. During this time, there is often a great amount of joy and excitement. It is also a time of considerable changes in the life of the parent(s). Regardless of how many times a mother gives birth, each child brings new challenges and dynamics to the family. It is not unusual to have feelings of excitement accompanied by confusing shifts in moods, emotions, and thoughts. All mothers are at risk of developing postpartum depression or the "baby  blues." These mood changes can occur right after giving birth, or they may occur many months after giving birth. The baby blues or postpartum depression can be mild or severe. Additionally, postpartum depression can resolve rather quickly, or it can be a long-term condition. CAUSES Elevated hormones and their rapid decline are thought to be a main cause of postpartum depression and the baby blues. There are a number of hormones that radically change during and after pregnancy. Estrogen and progesterone usually decrease immediately after delivering your baby. The level of thyroid hormone and various cortisol steroids also rapidly drop. Other factors that play a major role in these changes include major life events and genetics.  RISK FACTORS If you have any of the following risks for the baby blues or postpartum depression, know what symptoms to watch out for during the postpartum period. Risk factors that may increase the likelihood of getting the baby blues or postpartum depression include: 1. Havinga personal or family history of depression. 2. Having depression while being pregnant. 3. Having premenstrual or oral contraceptive-associated mood issues. 4. Having exceptional life stress. 5. Having marital conflict. 6. Lacking a social support network. 7. Having a baby with special needs. 8. Having health problems such as diabetes. SYMPTOMS Baby blues symptoms include:  Brief fluctuations in mood, such as going from extreme happiness to sadness.  Decreased concentration.  Difficulty sleeping.  Crying spells, tearfulness.  Irritability.  Anxiety. Postpartum depression symptoms typically begin within the first month after giving birth. These symptoms include:  Difficulty sleeping or excessive sleepiness.  Marked weight loss.  Agitation.  Feelings of worthlessness.  Lack of interest in activity or food. Postpartum psychosis is a very concerning condition and can be dangerous.  Fortunately, it is rare. Displaying any of the following symptoms is cause for immediate medical attention. Postpartum psychosis symptoms include:  Hallucinations and delusions.  Bizarre or disorganized behavior.  Confusion or disorientation. DIAGNOSIS  A diagnosis is made by an evaluation of your symptoms. There are no medical or lab tests that lead to a diagnosis, but there are various questionnaires that a caregiver may use to identify those with the baby blues, postpartum depression, or psychosis. Often times, a screening tool called the [place] Postnatal Depression Scale is used to diagnose depression in the postpartum period.  TREATMENT The baby blues usually goes away on its own in 1 to 2 weeks. Social support is often all that is  needed. You should be encouraged to get adequate sleep and rest. Occasionally, you may be given medicines to help you sleep.  Postpartum depression requires treatment as it can last several months or longer if it is not treated. Treatment may include individual or group therapy, medicine, or both to address any social, physiological, and psychological factors that may play a role in the depression. Regular exercise, a healthy diet, rest, and social support may also be strongly recommended.  Postpartum psychosis is more serious and needs treatment right away. Hospitalization is often needed. HOME CARE INSTRUCTIONS  Get as much rest as you can. Nap when the baby sleeps.  Exercise regularly. Some women find yoga and walking to be beneficial.  Eat a balanced and nourishing diet.  Do little things that you enjoy. Have a cup of tea, take a bubble bath, read your favorite magazine, or listen to your favorite music.  Avoid alcohol.  Ask for help with household chores, cooking, grocery shopping, or running errands as needed. Do not try to do everything.  Talk to people close to you about how you are feeling. Get support from your partner, family members, friends,  or other new moms.  Try to stay positive in how you think. Think about the things you are grateful for.  Do not spend a lot of time alone.  Only take medicine as directed by your caregiver.  Keep all your postpartum appointments.  Let your caregiver know if you have any concerns. SEEK MEDICAL CARE IF: You are having a reaction or problems with your medicine. SEEK IMMEDIATE MEDICAL CARE IF:  You have suicidal feelings.  You feel you may harm the baby or someone else. Document Released: 02/03/2004 Document Revised: 07/24/2011 Document Reviewed: 03/07/2011 [facility] Patient Information 2014 [place].     Breastfeeding Deciding to breastfeed is one of the best choices you can make for you and your baby. A change in hormones during pregnancy causes your breast tissue to grow and increases the number and size of your milk ducts. These hormones also allow proteins, sugars, and fats from your blood supply to make breast milk in your milk-producing glands. Hormones prevent breast milk from being released before your baby is born as well as prompt milk flow after birth. Once breastfeeding has begun, thoughts of your baby, as well as his or her sucking or crying, can stimulate the release of milk from your milk-producing glands.  BENEFITS OF BREASTFEEDING For Your Baby  Your first milk (colostrum) helps your baby's digestive system function better.   There are antibodies in your milk that help your baby fight off infections.   Your baby has a lower incidence of asthma, allergies, and sudden infant death syndrome.   The nutrients in breast milk are better for your baby than infant formulas and are designed uniquely for your baby's needs.   Breast milk improves your baby's brain development.   Your baby is less likely to develop other conditions, such as childhood obesity, asthma, or type 2 diabetes mellitus.  For You   Breastfeeding helps to create a very special bond  between you and your baby.   Breastfeeding is convenient. Breast milk is always available at the correct temperature and costs nothing.   Breastfeeding helps to burn calories and helps you lose the weight gained during pregnancy.   Breastfeeding makes your uterus contract to its prepregnancy size faster and slows bleeding (lochia) after you give birth.   Breastfeeding helps to lower your risk of  developing type 2 diabetes mellitus, osteoporosis, and breast or ovarian cancer later in life. SIGNS THAT YOUR BABY IS HUNGRY Early Signs of Hunger  Increased alertness or activity.  Stretching.  Movement of the head from side to side.  Movement of the head and opening of the mouth when the corner of the mouth or cheek is stroked (rooting).  Increased sucking sounds, smacking lips, cooing, sighing, or squeaking.  Hand-to-mouth movements.  Increased sucking of fingers or hands. Late Signs of Hunger  Fussing.  Intermittent crying. Extreme Signs of Hunger Signs of extreme hunger will require calming and consoling before your baby will be able to breastfeed successfully. Do not wait for the following signs of extreme hunger to occur before you initiate breastfeeding:   Restlessness.  A loud, strong cry.   Screaming.   BREASTFEEDING BASICS Breastfeeding Initiation  Find a comfortable place to sit or lie down, with your neck and back well supported.  Place a pillow or rolled up blanket under your baby to bring him or her to the level of your breast (if you are seated). Nursing pillows are specially designed to help support your arms and your baby while you breastfeed.  Make sure that your baby's abdomen is facing your abdomen.   Gently massage your breast. With your fingertips, massage from your chest wall toward your nipple in a circular motion. This encourages milk flow. You may need to continue this action during the feeding if your milk flows slowly.  Support your  breast with 4 fingers underneath and your thumb above your nipple. Make sure your fingers are well away from your nipple and your baby's mouth.   Stroke your baby's lips gently with your finger or nipple.   When your baby's mouth is open wide enough, quickly bring your baby to your breast, placing your entire nipple and as much of the colored area around your nipple (areola) as possible into your baby's mouth.   More areola should be visible above your baby's upper lip than below the lower lip.   Your baby's tongue should be between his or her lower gum and your breast.   Ensure that your baby's mouth is correctly positioned around your nipple (latched). Your baby's lips should create a seal on your breast and be turned out (everted).  It is common for your baby to suck about 2-3 minutes in order to start the flow of breast milk. Latching Teaching your baby how to latch on to your breast properly is very important. An improper latch can cause nipple pain and decreased milk supply for you and poor weight gain in your baby. Also, if your baby is not latched onto your nipple properly, he or she may swallow some air during feeding. This can make your baby fussy. Burping your baby when you switch breasts during the feeding can help to get rid of the air. However, teaching your baby to latch on properly is still the best way to prevent fussiness from swallowing air while breastfeeding. Signs that your baby has successfully latched on to your nipple:    Silent tugging or silent sucking, without causing you pain.   Swallowing heard between every 3-4 sucks.    Muscle movement above and in front of his or her ears while sucking.  Signs that your baby has not successfully latched on to nipple:   Sucking sounds or smacking sounds from your baby while breastfeeding.  Nipple pain. If you think your baby has not latched on  correctly, slip your finger into the corner of your baby's mouth to break  the suction and place it between your baby's gums. Attempt breastfeeding initiation again. Signs of Successful Breastfeeding Signs from your baby:   A gradual decrease in the number of sucks or complete cessation of sucking.   Falling asleep.   Relaxation of his or her body.   Retention of a small amount of milk in his or her mouth.   Letting go of your breast by himself or herself. Signs from you:  Breasts that have increased in firmness, weight, and size 1-3 hours after feeding.   Breasts that are softer immediately after breastfeeding.  Increased milk volume, as well as a change in milk consistency and color by the fifth day of breastfeeding.   Nipples that are not sore, cracked, or bleeding. Signs That Your Pecola Leisure is Getting Enough Milk  Wetting at least 3 diapers in a 24-hour period. The urine should be clear and pale yellow by age 27 days.  At least 3 stools in a 24-hour period by age 27 days. The stool should be soft and yellow.  At least 3 stools in a 24-hour period by age 80 days. The stool should be seedy and yellow.  No loss of weight greater than 10% of birth weight during the first 55 days of age.  Average weight gain of 4-7 ounces (113-198 g) per week after age 481 days.  Consistent daily weight gain by age 27 days, without weight loss after the age of 2 weeks. After a feeding, your baby may spit up a small amount. This is common. BREASTFEEDING FREQUENCY AND DURATION Frequent feeding will help you make more milk and can prevent sore nipples and breast engorgement. Breastfeed when you feel the need to reduce the fullness of your breasts or when your baby shows signs of hunger. This is called "breastfeeding on demand." Avoid introducing a pacifier to your baby while you are working to establish breastfeeding (the first 4-6 weeks after your baby is born). After this time you may choose to use a pacifier. Research has shown that pacifier use during the first year of a  baby's life decreases the risk of sudden infant death syndrome (SIDS). Allow your baby to feed on each breast as long as he or she wants. Breastfeed until your baby is finished feeding. When your baby unlatches or falls asleep while feeding from the first breast, offer the second breast. Because newborns are often sleepy in the first few weeks of life, you may need to awaken your baby to get him or her to feed. Breastfeeding times will vary from baby to baby. However, the following rules can serve as a guide to help you ensure that your baby is properly fed:  Newborns (babies 74 weeks of age or younger) may breastfeed every 1-3 hours.  Newborns should not go longer than 3 hours during the day or 5 hours during the night without breastfeeding.  You should breastfeed your baby a minimum of 8 times in a 24-hour period until you begin to introduce solid foods to your baby at around 10 months of age. BREAST MILK PUMPING Pumping and storing breast milk allows you to ensure that your baby is exclusively fed your breast milk, even at times when you are unable to breastfeed. This is especially important if you are going back to work while you are still breastfeeding or when you are not able to be present during feedings. Your Advertising copywriter can  give you guidelines on how long it is safe to store breast milk.  A breast pump is a machine that allows you to pump milk from your breast into a sterile bottle. The pumped breast milk can then be stored in a refrigerator or freezer. Some breast pumps are operated by hand, while others use electricity. Ask your lactation consultant which type will work best for you. Breast pumps can be purchased, but some hospitals and breastfeeding support groups lease breast pumps on a monthly basis. A lactation consultant can teach you how to hand express breast milk, if you prefer not to use a pump.  CARING FOR YOUR BREASTS WHILE YOU BREASTFEED Nipples can become dry, cracked, and  sore while breastfeeding. The following recommendations can help keep your breasts moisturized and healthy:  Avoid using soap on your nipples.   Wear a supportive bra. Although not required, special nursing bras and tank tops are designed to allow access to your breasts for breastfeeding without taking off your entire bra or top. Avoid wearing underwire-style bras or extremely tight bras.  Air dry your nipples for 3-62minutes after each feeding.   Use only cotton bra pads to absorb leaked breast milk. Leaking of breast milk between feedings is normal.   Use lanolin on your nipples after breastfeeding. Lanolin helps to maintain your skin's normal moisture barrier. If you use pure lanolin, you do not need to wash it off before feeding your baby again. Pure lanolin is not toxic to your baby. You may also hand express a few drops of breast milk and gently massage that milk into your nipples and allow the milk to air dry. In the first few weeks after giving birth, some women experience extremely full breasts (engorgement). Engorgement can make your breasts feel heavy, warm, and tender to the touch. Engorgement peaks within 3-5 days after you give birth. The following recommendations can help ease engorgement:  Completely empty your breasts while breastfeeding or pumping. You may want to start by applying warm, moist heat (in the shower or with warm water-soaked hand towels) just before feeding or pumping. This increases circulation and helps the milk flow. If your baby does not completely empty your breasts while breastfeeding, pump any extra milk after he or she is finished.  Wear a snug bra (nursing or regular) or tank top for 1-2 days to signal your body to slightly decrease milk production.  Apply ice packs to your breasts, unless this is too uncomfortable for you.  Make sure that your baby is latched on and positioned properly while breastfeeding. If engorgement persists after 48 hours of  following these recommendations, contact your health care provider or a Advertising copywriter. OVERALL HEALTH CARE RECOMMENDATIONS WHILE BREASTFEEDING  Eat healthy foods. Alternate between meals and snacks, eating 3 of each per day. Because what you eat affects your breast milk, some of the foods may make your baby more irritable than usual. Avoid eating these foods if you are sure that they are negatively affecting your baby.  Drink milk, fruit juice, and water to satisfy your thirst (about 10 glasses a day).   Rest often, relax, and continue to take your prenatal vitamins to prevent fatigue, stress, and anemia.  Continue breast self-awareness checks.  Avoid chewing and smoking tobacco.  Avoid alcohol and drug use. Some medicines that may be harmful to your baby can pass through breast milk. It is important to ask your health care provider before taking any medicine, including all over-the-counter and prescription  medicine as well as vitamin and herbal supplements. It is possible to become pregnant while breastfeeding. If birth control is desired, ask your health care provider about options that will be safe for your baby. SEEK MEDICAL CARE IF:   You feel like you want to stop breastfeeding or have become frustrated with breastfeeding.  You have painful breasts or nipples.  Your nipples are cracked or bleeding.  Your breasts are red, tender, or warm.  You have a swollen area on either breast.  You have a fever or chills.  You have nausea or vomiting.  You have drainage other than breast milk from your nipples.  Your breasts do not become full before feedings by the fifth day after you give birth.  You feel sad and depressed.  Your baby is too sleepy to eat well.  Your baby is having trouble sleeping.   Your baby is wetting less than 3 diapers in a 24-hour period.  Your baby has less than 3 stools in a 24-hour period.  Your baby's skin or the white part of his or her  eyes becomes yellow.   Your baby is not gaining weight by 75 days of age. SEEK IMMEDIATE MEDICAL CARE IF:   Your baby is overly tired (lethargic) and does not want to wake up and feed.  Your baby develops an unexplained fever. Document Released: 05/01/2005 Document Revised: 05/06/2013 Document Reviewed: 10/23/2012 [facility] Patient Information 2015 [place], [place]. This information is not intended to replace advice given to you by your health care provider. Make sure you discuss any questions you have with your health care provider.

## 2016-05-15 NOTE — L&D Delivery Note (Signed)
  Delivery Note  At 8:20 AM a viable female was delivered via  (Presentation:OA ).under water  APGAR:9/9 , ; weight pending .   Placenta status: intact w/3VC, .  Cord:  with the following complications: . Thick mec Anesthesia:  none Episiotomy:  none Lacerations:  nnone Suture Repair:  Est. Blood Loss (mL):  300  Mom to postpartum.  Baby to Couplet care / Skin to Skin.  CRESENZO-DISHMAN,Shiquan Mathieu 02/16/2017, 8:53 AM

## 2016-06-08 ENCOUNTER — Ambulatory Visit (INDEPENDENT_AMBULATORY_CARE_PROVIDER_SITE_OTHER): Payer: Medicaid Other

## 2016-06-08 VITALS — BP 124/87 | HR 85 | Temp 98.0°F | Wt 153.4 lb

## 2016-06-08 DIAGNOSIS — Z3201 Encounter for pregnancy test, result positive: Secondary | ICD-10-CM | POA: Diagnosis not present

## 2016-06-08 DIAGNOSIS — Z3200 Encounter for pregnancy test, result unknown: Secondary | ICD-10-CM

## 2016-06-08 LAB — POCT URINE PREGNANCY: Preg Test, Ur: POSITIVE — AB

## 2016-06-08 NOTE — Progress Notes (Signed)
Patient in office for upt. Test positive, last lmp was 05/09/16, pt. Approximately 8785w2d for this visit will rto in six weeks for initial NOB.

## 2016-07-03 ENCOUNTER — Telehealth: Payer: Self-pay

## 2016-07-03 ENCOUNTER — Inpatient Hospital Stay (HOSPITAL_COMMUNITY): Payer: Medicaid Other

## 2016-07-03 ENCOUNTER — Inpatient Hospital Stay (HOSPITAL_COMMUNITY)
Admission: AD | Admit: 2016-07-03 | Discharge: 2016-07-03 | Disposition: A | Discharge status: Home / Self Care | Payer: Medicaid Other | Source: Ambulatory Visit | Attending: Obstetrics & Gynecology | Admitting: Obstetrics & Gynecology

## 2016-07-03 ENCOUNTER — Encounter (HOSPITAL_COMMUNITY): Payer: Self-pay | Admitting: *Deleted

## 2016-07-03 DIAGNOSIS — R1030 Lower abdominal pain, unspecified: Secondary | ICD-10-CM | POA: Insufficient documentation

## 2016-07-03 DIAGNOSIS — N949 Unspecified condition associated with female genital organs and menstrual cycle: Secondary | ICD-10-CM

## 2016-07-03 DIAGNOSIS — O26891 Other specified pregnancy related conditions, first trimester: Secondary | ICD-10-CM | POA: Diagnosis not present

## 2016-07-03 DIAGNOSIS — Z3A01 Less than 8 weeks gestation of pregnancy: Secondary | ICD-10-CM | POA: Diagnosis not present

## 2016-07-03 HISTORY — DX: Depression, unspecified: F32.A

## 2016-07-03 HISTORY — DX: Major depressive disorder, single episode, unspecified: F32.9

## 2016-07-03 LAB — URINALYSIS, ROUTINE W REFLEX MICROSCOPIC
Bilirubin Urine: NEGATIVE
GLUCOSE, UA: NEGATIVE mg/dL
Hgb urine dipstick: NEGATIVE
KETONES UR: NEGATIVE mg/dL
Nitrite: NEGATIVE
PROTEIN: NEGATIVE mg/dL
Specific Gravity, Urine: 1.02 (ref 1.005–1.030)
pH: 7 (ref 5.0–8.0)

## 2016-07-03 LAB — WET PREP, GENITAL
CLUE CELLS WET PREP: NONE SEEN
Sperm: NONE SEEN
Trich, Wet Prep: NONE SEEN
WBC WET PREP: NONE SEEN
YEAST WET PREP: NONE SEEN

## 2016-07-03 NOTE — MAU Note (Signed)
Pt presents to MAU with complaints of lower abdominal pain that comes and goes for a week. Denies any vaginal bleeding or abnormal discharge

## 2016-07-03 NOTE — MAU Provider Note (Signed)
History     CSN: [id number]  Arrival date and time: 07/03/16 1800   None     Chief Complaint  Patient presents with  . Abdominal Pain   HPI 31 yo g3P2 at [redacted]w[redacted]d by LMP presenting today for the evaluation of Kenyatta Gloeckner lower abdominal pain which has been present for the past several days. Patient denies dysuria, abnormal discharge, or vaginal bleeding. She plans to start prenatal care with [facility] on 07/20/2016. Patient is without complaints    Past Medical History:  Diagnosis Date  . Depression   . Medical history non-contributory   . Vaginal Pap smear, abnormal     Past Surgical History:  Procedure Laterality Date  . NO PAST SURGERIES    . WISDOM TOOTH EXTRACTION      Family History  Problem Relation Age of Onset  . Diabetes Father     Social History  Substance Use Topics  . Smoking status: Never Smoker  . Smokeless tobacco: Never Used  . Alcohol use No    Allergies:  Allergies  Allergen Reactions  . Benadryl [Diphenhydramine] Anaphylaxis  . Latex Itching  . Night-Time Cough [Doxylamine-Dm] Palpitations    Prescriptions Prior to Admission  Medication Sig Dispense Refill Last Dose  . Prenatal Vit-Fe Fumarate-FA (PRENATAL MULTIVITAMIN) TABS tablet Take 1 tablet by mouth daily at 12 noon.   Past Week at Unknown time  . ibuprofen (ADVIL,MOTRIN) 600 MG tablet Take 1 tablet (600 mg total) by mouth every 6 (six) hours. (Patient not taking: Reported on 07/03/2016) 30 tablet 0 Not Taking at Unknown time  . oxyCODONE-acetaminophen (PERCOCET/ROXICET) 5-325 MG per tablet Take 1 tablet by mouth every 4 (four) hours as needed (for pain scale 4-7). (Patient not taking: Reported on 07/03/2016) 30 tablet 0 Not Taking at Unknown time    Review of Systems  See pertinent in HPI Physical Exam   Blood pressure 121/80, pulse 85, temperature 98.6 F (37 C), resp. rate 16, height 5\' 2"  (1.575 m), weight 156 lb 3.2 oz (70.9 kg), last menstrual period 05/09/2016, unknown if currently  breastfeeding.  Physical Exam GENERAL: Well-developed, well-nourished female in no acute distress.  ABDOMEN: Soft, nontender, nondistended.  PELVIC: Normal external female genitalia. Vagina is pink and rugated.  Normal discharge. Normal appearing cervix. Uterus is normal in size. No adnexal mass or tenderness. EXTREMITIES: No cyanosis, clubbing, or edema, 2+ distal pulses.  MAU Course  Procedures  MDM UA- negative Wet prep - negative  US Ob Comp Less 14 Wks  Result Date: 07/03/2016 CLINICAL DATA:  Pregnant patient with lower abdominal pain. EXAM: OBSTETRIC <14 WK [place] AND TRANSVAGINAL OB US TECHNIQUE: Both transabdominal and transvaginal ultrasound examinations were performed for complete evaluation of the gestation as well as the maternal uterus, adnexal regions, and pelvic cul-de-sac. Transvaginal technique was performed to assess early pregnancy. COMPARISON:  None. FINDINGS: Intrauterine gestational sac: Single visualized. Yolk sac:  Visualized. Embryo:  Visualized. Cardiac Activity: Detected. Heart Rate: 166  bpm CRL:  15  mm   7 w   5 d                  [place] EDC: 02/14/2017 Subchorionic hemorrhage:  None visualized. Maternal uterus/adnexae: Corpus luteum cyst on the right noted. No fluid collection. IMPRESSION: Single living intrauterine pregnancy.  No acute abnormality. Electronically Signed   By: Drusilla Kanner M.D.   On: 07/03/2016 19:51   US Ob Transvaginal  Result Date: 07/03/2016 CLINICAL DATA:  Pregnant patient with lower abdominal pain. EXAM:  OBSTETRIC <14 WK US AND TRANSVAGINAL OB US TECHNIQUE: Both transabdominal and transvaginal ultrasound examinations were performed for complete evaluation of the gestation as well as the maternal uterus, adnexal regions, and pelvic cul-de-sac. Transvaginal technique was performed to assess early pregnancy. COMPARISON:  None. FINDINGS: Intrauterine gestational sac: Single visualized. Yolk sac:  Visualized. Embryo:  Visualized. Cardiac Activity:  Detected. Heart Rate: 166  bpm CRL:  15  mm   7 w   5 d                  US EDC: 02/14/2017 Subchorionic hemorrhage:  None visualized. Maternal uterus/adnexae: Corpus luteum cyst on the right noted. No fluid collection. IMPRESSION: Single living intrauterine pregnancy.  No acute abnormality. Electronically Signed   By: Drusilla Kannerhomas  Dalessio M.D.   On: 07/03/2016 19:51     Assessment and Plan  31 yo G3P2 at 7484w6d consistent with 1st trimester ultrasound - Urine culture and GC/Cl cultures are pending - Reassurance provided - Pain likely related to round ligament - Ultrasound results reviewed with the patient - Follow up as planned for initial prenatal visit - Precautions reviewed  Amorina Doerr 07/03/2016, 8:32 PM

## 2016-07-03 NOTE — Discharge Instructions (Signed)
Round Ligament Pain Introduction The round ligament is a cord of muscle and tissue that helps to support the uterus. It can become a source of pain during pregnancy if it becomes stretched or twisted as the baby grows. The pain usually begins in the second trimester of pregnancy, and it can come and go until the baby is delivered. It is not a serious problem, and it does not cause harm to the baby. Round ligament pain is usually a short, sharp, and pinching pain, but it can also be a dull, lingering, and aching pain. The pain is felt in the lower side of the abdomen or in the groin. It usually starts deep in the groin and moves up to the outside of the hip area. Pain can occur with:  A sudden change in position.  Rolling over in bed.  Coughing or sneezing.  Physical activity. Follow these instructions at home: Watch your condition for any changes. Take these steps to help with your pain:  When the pain starts, relax. Then try:  Sitting down.  Flexing your knees up to your abdomen.  Lying on your side with one pillow under your abdomen and another pillow between your legs.  Sitting in a warm bath for 15-20 minutes or until the pain goes away.  Take over-the-counter and prescription medicines only as told by your health care provider.  Move slowly when you sit and stand.  Avoid long walks if they cause pain.  Stop or lessen your physical activities if they cause pain. Contact a health care provider if:  Your pain does not go away with treatment.  You feel pain in your back that you did not have before.  Your medicine is not helping. Get help right away if:  You develop a fever or chills.  You develop uterine contractions.  You develop vaginal bleeding.  You develop nausea or vomiting.  You develop diarrhea.  You have pain when you urinate. This information is not intended to replace advice given to you by your health care provider. Make sure you discuss any questions  you have with your health care provider. Document Released: 02/08/2008 Document Revised: 10/07/2015 Document Reviewed: 07/08/2014  2017 [employer]vier  

## 2016-07-03 NOTE — Telephone Encounter (Signed)
Returned call and patient stated that she is about [redacted] weeks pregnant and has been having constant lower abdominal pain rating a 2 when sitting and a 5-6 when standing. Patient denies bleeding, [facility], upcoming new OB appt is 07-20-16.

## 2016-07-04 LAB — GC/CHLAMYDIA PROBE AMP (~~LOC~~) NOT AT ARMC
Chlamydia: NEGATIVE
Neisseria Gonorrhea: NEGATIVE

## 2016-07-05 LAB — CULTURE, OB URINE

## 2016-07-20 ENCOUNTER — Encounter: Payer: Self-pay | Admitting: Obstetrics and Gynecology

## 2016-07-20 ENCOUNTER — Ambulatory Visit (INDEPENDENT_AMBULATORY_CARE_PROVIDER_SITE_OTHER): Payer: Medicaid Other | Admitting: Certified Nurse Midwife

## 2016-07-20 ENCOUNTER — Other Ambulatory Visit (HOSPITAL_COMMUNITY)
Admission: RE | Admit: 2016-07-20 | Discharge: 2016-07-20 | Disposition: A | Discharge status: Home / Self Care | Payer: Medicaid Other | Source: Ambulatory Visit | Attending: Obstetrics and Gynecology | Admitting: Obstetrics and Gynecology

## 2016-07-20 ENCOUNTER — Encounter: Payer: Self-pay | Admitting: Certified Nurse Midwife

## 2016-07-20 VITALS — BP 131/82 | HR 106 | Wt 153.2 lb

## 2016-07-20 DIAGNOSIS — Z3481 Encounter for supervision of other normal pregnancy, first trimester: Secondary | ICD-10-CM

## 2016-07-20 DIAGNOSIS — Z3480 Encounter for supervision of other normal pregnancy, unspecified trimester: Secondary | ICD-10-CM

## 2016-07-20 DIAGNOSIS — Z01419 Encounter for gynecological examination (general) (routine) without abnormal findings: Secondary | ICD-10-CM | POA: Insufficient documentation

## 2016-07-20 DIAGNOSIS — Z3490 Encounter for supervision of normal pregnancy, unspecified, unspecified trimester: Secondary | ICD-10-CM | POA: Insufficient documentation

## 2016-07-20 NOTE — Progress Notes (Signed)
Subjective:    Amy Jacobson is being seen today for her first obstetrical visit.  This is a planned pregnancy. She is at 156w2d gestation. Her obstetrical history is significant for none. Relationship with FOB: spouse, living together. Patient does intend to breast feed. Pregnancy history fully reviewed. Pt stated she is having n/v but denies needing anything, pt like natural remedies, pt is having long ligament pain on her abdomen and uses tape stripes which helps with pain. Pt denies having dysuria, itching, vaginal pain or, discharge, no h/o sti. Pt taking PNV vitamins.   The information documented in the HPI was reviewed and verified.  Menstrual History: OB History    Gravida Para Term Preterm AB Living   3 2 2     2    SAB TAB Ectopic Multiple Live Births         0 2       Patient's last menstrual period was 05/09/2016 (exact date).    Past Medical History:  Diagnosis Date  . Depression   . Medical history non-contributory   . Vaginal Pap smear, abnormal     Past Surgical History:  Procedure Laterality Date  . NO PAST SURGERIES    . WISDOM TOOTH EXTRACTION       (Not in a hospital admission) Allergies  Allergen Reactions  . Benadryl [Diphenhydramine] Anaphylaxis  . Latex Itching  . Night-Time Cough [Doxylamine-Dm] Palpitations    Social History  Substance Use Topics  . Smoking status: Never Smoker  . Smokeless tobacco: Never Used  . Alcohol use No    Family History  Problem Relation Age of Onset  . Diabetes Father      Review of Systems Constitutional: negative for weight loss Gastrointestinal: negative for vomiting Genitourinary:negative for genital lesions and vaginal discharge and dysuria Musculoskeletal:negative for back pain Behavioral/Psych: negative for abusive relationship, depression, illegal drug usage and tobacco use    Objective:    BP 131/82   Pulse (!) 106   Wt 153 lb 3.2 oz (69.5 kg)   LMP 05/09/2016 (Exact Date)   BMI 28.02 kg/m   General Appearance:    Alert, cooperative, no distress, appears stated age  Head:    Normocephalic, without obvious abnormality, atraumatic  Eyes:    PERRL, conjunctiva/corneas clear, EOM's intact, fundi    benign, both eyes  Ears:    Normal TM's and external ear canals, both ears  Nose:   Nares normal, septum midline, mucosa normal, no drainage    or sinus tenderness  Throat:   Lips, mucosa, and tongue normal; teeth and gums normal  Neck:   Supple, symmetrical, trachea midline, no adenopathy;    thyroid:  no enlargement/tenderness/nodules; no carotid   bruit or JVD  Back:     Symmetric, no curvature, ROM normal, no CVA tenderness  Lungs:     Clear to auscultation bilaterally, respirations unlabored  Chest Wall:    No tenderness or deformity   Heart:    Regular rate and rhythm, S1 and S2 normal, no murmur, rub   or gallop  Breast Exam:    No tenderness, masses, or nipple abnormality  Abdomen:     Soft, non-tender, bowel sounds active all four quadrants,    no masses, no organomegaly  Genitalia:    Normal female without lesion, discharge or tenderness  Extremities:   Extremities normal, atraumatic, no cyanosis or edema  Pulses:   2+ and symmetric all extremities  Skin:   Skin color, texture, turgor normal,  no rashes or lesions  Lymph nodes:   Cervical, supraclavicular, and axillary nodes normal  Neurologic:   CNII-XII intact, normal strength, sensation and reflexes    throughout    Cervix= closed, thick , long, friable,  No CMT, uterus non tender, 10 weeks in size.  FHT= 172, uterus consistent with   Lab Review Urine pregnancy test Labs reviewed yes Radiologic studies reviewed no  Assessment & Plan:    Pregnancy at [redacted]w[redacted]d weeks       Supervision of other normal pregnancy, antepartum - Plan: Cytology - PAP, TSH, Hemoglobinopathy evaluation, Varicella zoster antibody, IgG, Culture, OB Urine, Hemoglobin A1c, Obstetric Panel, Including HIV, Cystic Fibrosis Mutation 97, MaterniT21  PLUS Core+SCA, [place] MFM OB COMP + 14 WK Pt is on baby scripts will f/u at 12 weeks.    Prenatal vitamins.  Counseling provided regarding continued use of seat belts, cessation of alcohol consumption, smoking or use of illicit drugs; infection precautions i.e., influenza/TDAP immunizations, toxoplasmosis,CMV, parvovirus, listeria and varicella; workplace safety, exercise during pregnancy; routine dental care, safe medications, sexual activity, hot tubs, saunas, pools, travel, caffeine use, fish and methlymercury, potential toxins, hair treatments, varicose veins Weight gain recommendations per IOM guidelines reviewed: underweight/BMI< 18.5--> gain 28 - 40 lbs; normal weight/BMI 18.5 - 24.9--> gain 25 - 35 lbs; overweight/BMI 25 - 29.9--> gain 15 - 25 lbs; obese/BMI >30->gain  11 - 20 lbs Problem list reviewed and updated. FIRST/CF mutation testing/NIPT/QUAD SCREEN/fragile X/Ashkenazi Jewish population testing/Spinal muscular atrophy discussed: requested. Role of ultrasound in pregnancy discussed; fetal survey: requested. Amniocentesis discussed: not indicated. VBAC calculator score: VBAC consent form provided No orders of the defined types were placed in this encounter.  Orders Placed This Encounter  Procedures  . Culture, OB Urine  . [place] MFM OB COMP + 14 WK    Standing Status:   Future    Standing Expiration Date:   09/19/2017    Order Specific Question:   Reason for Exam (SYMPTOM  OR DIAGNOSIS REQUIRED)    Answer:   fetal anatomy scan    Order Specific Question:   Preferred imaging location?    Answer:   [facility]  . TSH  . Hemoglobinopathy evaluation  . Varicella zoster antibody, IgG  . Hemoglobin A1c  . Obstetric Panel, Including HIV  . Cystic Fibrosis Mutation 97  . MaterniT21 PLUS Core+SCA    Order Specific Question:   Is the patient insulin dependent?    Answer:   No    Order Specific Question:   Please enter gestational age. This should be expressed as weeks AND days, i.e.  16w 6d. Enter weeks here. Enter days in next question.    Answer:   4    Order Specific Question:   Please enter gestational age. This should be expressed as weeks AND days, i.e. 16w 6d. Enter days here. Enter weeks in previous question.    Answer:   2    Order Specific Question:   How was gestational age calculated?    Answer:   LMP    Order Specific Question:   Please give the date of LMP OR Ultrasound OR Estimated date of delivery.    Answer:   02/13/2017    Order Specific Question:   Number of Fetuses (Type of Pregnancy):    Answer:   1    Order Specific Question:   Indications for performing the test? (please choose all that apply):    Answer:   Routine screening    Order Specific  Question:   Other Indications? (Y=Yes, N=No)    Answer:   N    Order Specific Question:   If this is a repeat specimen, please indicate the reason:    Answer:   Not indicated    Order Specific Question:   Please specify the patient's race: (C=White/Caucasion, B=Black, I=Native American, A=Asian, H=Hispanic, O=Other, U=Unknown)    Answer:   B    Order Specific Question:   Donor Egg - indicate if the egg was obtained from in vitro fertilization.    Answer:   N    Order Specific Question:   Age of Egg Donor.    Answer:   36    Order Specific Question:   Prior Down Syndrome/ONTD screening during current pregnancy.    Answer:   N    Order Specific Question:   Prior First Trimester Testing    Answer:   N    Order Specific Question:   Prior Second Trimester Testing    Answer:   N    Order Specific Question:   Family History of Neural Tube Defects    Answer:   N    Order Specific Question:   Prior Pregnancy with Down Syndrome    Answer:   N    Order Specific Question:   Please give the patient's weight (in pounds)    Answer:   145    Follow up in 4 weeks. 50% of 30 min visit spent on counseling and coordination of care.

## 2016-07-20 NOTE — Progress Notes (Signed)
Patient has no concerns today. 

## 2016-07-21 LAB — CYTOLOGY - PAP: Diagnosis: NEGATIVE

## 2016-07-25 ENCOUNTER — Other Ambulatory Visit: Payer: Self-pay | Admitting: Certified Nurse Midwife

## 2016-07-25 DIAGNOSIS — Z3480 Encounter for supervision of other normal pregnancy, unspecified trimester: Secondary | ICD-10-CM

## 2016-07-25 LAB — OBSTETRIC PANEL, INCLUDING HIV
Antibody Screen: NEGATIVE
BASOS ABS: 0 10*3/uL (ref 0.0–0.2)
Basos: 0 %
EOS (ABSOLUTE): 0 10*3/uL (ref 0.0–0.4)
EOS: 1 %
HEMATOCRIT: 41.6 % (ref 34.0–46.6)
HEMOGLOBIN: 13.8 g/dL (ref 11.1–15.9)
HEP B S AG: NEGATIVE
HIV SCREEN 4TH GENERATION: NONREACTIVE
Immature Grans (Abs): 0 10*3/uL (ref 0.0–0.1)
Immature Granulocytes: 0 %
LYMPHS ABS: 1.2 10*3/uL (ref 0.7–3.1)
Lymphs: 18 %
MCH: 30.5 pg (ref 26.6–33.0)
MCHC: 33.2 g/dL (ref 31.5–35.7)
MCV: 92 fL (ref 79–97)
MONOCYTES: 5 %
Monocytes Absolute: 0.3 10*3/uL (ref 0.1–0.9)
NEUTROS ABS: 4.9 10*3/uL (ref 1.4–7.0)
Neutrophils: 76 %
Platelets: 274 10*3/uL (ref 150–379)
RBC: 4.52 x10E6/uL (ref 3.77–5.28)
RDW: 13.2 % (ref 12.3–15.4)
RH TYPE: POSITIVE
RPR: NONREACTIVE
Rubella Antibodies, IGG: 1.41 index (ref >0.99)
WBC: 6.4 10*3/uL (ref 3.4–10.8)

## 2016-07-25 LAB — HEMOGLOBINOPATHY EVALUATION
HGB A: 97.5 % (ref 96.4–98.8)
HGB C: 0 %
HGB S: 0 %
HGB VARIANT: 0 %
Hemoglobin A2 Quantitation: 2.5 % (ref 1.8–3.2)
Hemoglobin F Quantitation: 0 % (ref 0.0–2.0)

## 2016-07-25 LAB — TSH: TSH: 0.187 u[IU]/mL — AB (ref 0.450–4.500)

## 2016-07-25 LAB — HEMOGLOBIN A1C
ESTIMATED AVERAGE GLUCOSE: 94 mg/dL
Hgb A1c MFr Bld: 4.9 % (ref 4.8–5.6)

## 2016-07-25 LAB — VARICELLA ZOSTER ANTIBODY, IGG: Varicella zoster IgG: 1258 index (ref >165)

## 2016-07-26 LAB — MATERNIT21 PLUS CORE+SCA
CHROMOSOME 18: NEGATIVE
Chromosome 13: NEGATIVE
Chromosome 21: NEGATIVE
Y CHROMOSOME: NOT DETECTED

## 2016-07-26 LAB — SPECIMEN STATUS REPORT

## 2016-07-26 LAB — URINE CULTURE, OB REFLEX

## 2016-07-26 LAB — CULTURE, OB URINE

## 2016-07-27 ENCOUNTER — Other Ambulatory Visit: Payer: Self-pay | Admitting: Certified Nurse Midwife

## 2016-07-27 DIAGNOSIS — Z3480 Encounter for supervision of other normal pregnancy, unspecified trimester: Secondary | ICD-10-CM

## 2016-07-28 ENCOUNTER — Other Ambulatory Visit: Payer: Self-pay | Admitting: Certified Nurse Midwife

## 2016-07-28 DIAGNOSIS — R7989 Other specified abnormal findings of blood chemistry: Secondary | ICD-10-CM | POA: Insufficient documentation

## 2016-07-28 LAB — THYROID PANEL
FREE THYROXINE INDEX: 3 (ref 1.2–4.9)
T3 Uptake Ratio: 23 % — ABNORMAL LOW (ref 24–39)
T4, Total: 13 ug/dL — ABNORMAL HIGH (ref 4.5–12.0)

## 2016-07-28 LAB — SPECIMEN STATUS REPORT

## 2016-08-02 ENCOUNTER — Other Ambulatory Visit: Payer: Self-pay | Admitting: Certified Nurse Midwife

## 2016-08-02 DIAGNOSIS — Z3480 Encounter for supervision of other normal pregnancy, unspecified trimester: Secondary | ICD-10-CM

## 2016-08-02 LAB — CYSTIC FIBROSIS MUTATION 97

## 2016-09-19 ENCOUNTER — Ambulatory Visit (HOSPITAL_COMMUNITY)
Admission: RE | Admit: 2016-09-19 | Discharge: 2016-09-19 | Disposition: A | Discharge status: Home / Self Care | Payer: Medicaid Other | Source: Ambulatory Visit | Attending: Certified Nurse Midwife | Admitting: Certified Nurse Midwife

## 2016-09-19 DIAGNOSIS — Z3689 Encounter for other specified antenatal screening: Secondary | ICD-10-CM | POA: Insufficient documentation

## 2016-09-19 DIAGNOSIS — Z3482 Encounter for supervision of other normal pregnancy, second trimester: Secondary | ICD-10-CM | POA: Diagnosis not present

## 2016-09-19 DIAGNOSIS — Z3A19 19 weeks gestation of pregnancy: Secondary | ICD-10-CM | POA: Diagnosis not present

## 2016-09-19 DIAGNOSIS — Z3480 Encounter for supervision of other normal pregnancy, unspecified trimester: Secondary | ICD-10-CM

## 2016-09-24 ENCOUNTER — Other Ambulatory Visit: Payer: Self-pay | Admitting: Certified Nurse Midwife

## 2016-09-24 DIAGNOSIS — Z3480 Encounter for supervision of other normal pregnancy, unspecified trimester: Secondary | ICD-10-CM

## 2016-09-26 ENCOUNTER — Encounter: Payer: Self-pay | Admitting: Certified Nurse Midwife

## 2016-09-26 ENCOUNTER — Ambulatory Visit (INDEPENDENT_AMBULATORY_CARE_PROVIDER_SITE_OTHER): Payer: Medicaid Other | Admitting: Certified Nurse Midwife

## 2016-09-26 VITALS — BP 115/75 | HR 94 | Wt 161.6 lb

## 2016-09-26 DIAGNOSIS — Z3480 Encounter for supervision of other normal pregnancy, unspecified trimester: Secondary | ICD-10-CM

## 2016-09-26 DIAGNOSIS — R946 Abnormal results of thyroid function studies: Secondary | ICD-10-CM

## 2016-09-26 DIAGNOSIS — R7989 Other specified abnormal findings of blood chemistry: Secondary | ICD-10-CM

## 2016-09-26 DIAGNOSIS — Z3482 Encounter for supervision of other normal pregnancy, second trimester: Secondary | ICD-10-CM

## 2016-09-26 NOTE — Progress Notes (Signed)
   PRENATAL VISIT NOTE  Subjective:  Amy Jacobson is a 31 y.o. G3P2002 at 3668w0d being seen today for ongoing prenatal care.  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  Patient reports bilataeral rash on feet,calomine lotion no relief. Patient reports going in grass Contractions: Not present. Vag. Bleeding: None.  Movement: Present. Denies leaking of fluid.   The following portions of the patient's history were reviewed and updated as appropriate: allergies, current medications, past family history, past medical history, past social history, past surgical history and problem list. Problem list updated.  Objective:   Vitals:   09/26/16 0850  BP: 115/75  Pulse: 94  Weight: 161 lb 9.6 oz (73.3 kg)    Fetal Status: Fetal Heart Rate (bpm): 160 Fundal Height: 20 cm Movement: Present     General:  Alert, oriented and cooperative. Patient is in no acute distress.  Skin: Skin is warm and dry. No rash noted.   Cardiovascular: Normal heart rate noted  Respiratory: Normal respiratory effort, no problems with respiration noted  Abdomen: Soft, gravid, appropriate for gestational age. Pain/Pressure: Present     Pelvic:  Cervical exam deferred        Extremities: Normal range of motion.  Edema: None  Skin Feet-flattened red areas.  Mental Status: Normal mood and affect. Normal behavior. Normal judgment and thought content.         Assessment and Plan:  Pregnancy: G3P2002 at 4768w0d  Supervision of other normal pregnancy, antepartum  Abnormal TSH-  08/25/2016- Endocrinology--labs normal   1. Supervision of other normal pregnancy, antepartum    Patient desires waterbirth, previous hx of successful waterbirth.  Waterbirth class information given to patient.   Preterm  labor symptoms and general obstetric precautions including but not limited to vaginal bleeding, contractions, leaking of fluid and fetal movement were reviewed in detail with the patient. Please refer to After Visit Summary for other counseling recommendations.  Return for babyscripts, 2 hr OGTT. Desires to do 1 hour OGTT with [employer].    Roe Coombsenney, Rachelle A, CNM

## 2016-09-26 NOTE — Progress Notes (Signed)
Patient reports good fetal movement, complains of rash on feet since April after walking through the grass. Pt states that rash got worse after using calamine spray, denies contractions and bleeding.

## 2016-11-21 ENCOUNTER — Other Ambulatory Visit: Payer: Medicaid Other

## 2016-11-21 ENCOUNTER — Ambulatory Visit (INDEPENDENT_AMBULATORY_CARE_PROVIDER_SITE_OTHER): Payer: Medicaid Other | Admitting: Certified Nurse Midwife

## 2016-11-21 ENCOUNTER — Encounter: Payer: Self-pay | Admitting: Certified Nurse Midwife

## 2016-11-21 VITALS — BP 111/71 | HR 90 | Wt 168.0 lb

## 2016-11-21 DIAGNOSIS — R7989 Other specified abnormal findings of blood chemistry: Secondary | ICD-10-CM

## 2016-11-21 DIAGNOSIS — Z3480 Encounter for supervision of other normal pregnancy, unspecified trimester: Secondary | ICD-10-CM

## 2016-11-21 DIAGNOSIS — Z3483 Encounter for supervision of other normal pregnancy, third trimester: Secondary | ICD-10-CM

## 2016-11-21 DIAGNOSIS — R946 Abnormal results of thyroid function studies: Secondary | ICD-10-CM

## 2016-11-21 NOTE — Progress Notes (Signed)
   PRENATAL VISIT NOTE  Subjective:  Amy Jacobson is a 31 y.o. G3P2002 at 3238w0d being seen today for ongoing prenatal care.  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  Patient reports no complaints.  Contractions: Not present. Vag. Bleeding: None.  Movement: Present. Denies leaking of fluid.   The following portions of the patient's history were reviewed and updated as appropriate: allergies, current medications, past family history, past medical history, past social history, past surgical history and problem list. Problem list updated.  Objective:   Vitals:   11/21/16 0902  BP: 111/71  Pulse: 90  Weight: 168 lb (76.2 kg)    Fetal Status: Fetal Heart Rate (bpm): 140 Fundal Height: 28 cm Movement: Present     General:  Alert, oriented and cooperative. Patient is in no acute distress.  Skin: Skin is warm and dry. No rash noted.   Cardiovascular: Normal heart rate noted  Respiratory: Normal respiratory effort, no problems with respiration noted  Abdomen: Soft, gravid, appropriate for gestational age. Pain/Pressure: Present     Pelvic:  Cervical exam deferred        Extremities: Normal range of motion.     Mental Status: Normal mood and affect. Normal behavior. Normal judgment and thought content.   Assessment and Plan:  Pregnancy: G3P2002 at 2538w0d  1. Supervision of other normal pregnancy, antepartum     Doing well.  Undecided about waterbirth.  Waterbirth class encouraged.  - CBC - HIV antibody - RPR - Glucose tolerance, 1 hour  2. Abnormal TSH      Hx of low TSH, recheck today. - TSH Pregnancy  Preterm labor symptoms and general obstetric precautions including but not limited to vaginal bleeding, contractions, leaking of fluid and fetal movement  were reviewed in detail with the patient. Please refer to After Visit Summary for other counseling recommendations.  Return in about 4 weeks (around 12/19/2016) for ROB, babyscripts.   Roe Coombsachelle A Benisha Hadaway, CNM

## 2016-11-23 ENCOUNTER — Other Ambulatory Visit: Payer: Self-pay | Admitting: Certified Nurse Midwife

## 2016-11-23 DIAGNOSIS — Z3480 Encounter for supervision of other normal pregnancy, unspecified trimester: Secondary | ICD-10-CM

## 2016-11-23 LAB — TSH PREGNANCY: TSH PREGNANCY: 0.756 u[IU]/mL (ref 0.450–4.500)

## 2016-11-23 LAB — HIV ANTIBODY (ROUTINE TESTING W REFLEX): HIV Screen 4th Generation wRfx: NONREACTIVE

## 2016-11-23 LAB — CBC
HEMATOCRIT: 37.2 % (ref 34.0–46.6)
HEMOGLOBIN: 11.6 g/dL (ref 11.1–15.9)
MCH: 29.9 pg (ref 26.6–33.0)
MCHC: 31.2 g/dL — ABNORMAL LOW (ref 31.5–35.7)
MCV: 96 fL (ref 79–97)
Platelets: 239 10*3/uL (ref 150–379)
RBC: 3.88 x10E6/uL (ref 3.77–5.28)
RDW: 13.3 % (ref 12.3–15.4)
WBC: 6.9 10*3/uL (ref 3.4–10.8)

## 2016-11-23 LAB — RPR: RPR: NONREACTIVE

## 2016-11-23 LAB — GLUCOSE TOLERANCE, 1 HOUR: GLUCOSE, 1HR PP: 136 mg/dL (ref 65–199)

## 2016-12-19 ENCOUNTER — Ambulatory Visit (INDEPENDENT_AMBULATORY_CARE_PROVIDER_SITE_OTHER): Payer: Medicaid Other | Admitting: Certified Nurse Midwife

## 2016-12-19 VITALS — BP 122/77 | HR 102 | Wt 169.8 lb

## 2016-12-19 DIAGNOSIS — Z3483 Encounter for supervision of other normal pregnancy, third trimester: Secondary | ICD-10-CM

## 2016-12-19 DIAGNOSIS — Z3480 Encounter for supervision of other normal pregnancy, unspecified trimester: Secondary | ICD-10-CM

## 2016-12-19 NOTE — Progress Notes (Signed)
   PRENATAL VISIT NOTE  Subjective:  Amy Jacobson is a 31 y.o. G3P2002 at 9520w0d being seen today for ongoing prenatal care.  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  Patient reports no complaints.  Contractions: Not present. Vag. Bleeding: None.  Movement: Present. Denies leaking of fluid.   The following portions of the patient's history were reviewed and updated as appropriate: allergies, current medications, past family history, past medical history, past social history, past surgical history and problem list. Problem list updated.  Objective:   Vitals:   12/19/16 0810  BP: 122/77  Pulse: (!) 102  Weight: 169 lb 12.8 oz (77 kg)    Fetal Status: Fetal Heart Rate (bpm): 150 Fundal Height: 32 cm Movement: Present     General:  Alert, oriented and cooperative. Patient is in no acute distress.  Skin: Skin is warm and dry. No rash noted.   Cardiovascular: Normal heart rate noted  Respiratory: Normal respiratory effort, no problems with respiration noted  Abdomen: Soft, gravid, appropriate for gestational age.  Pain/Pressure: Absent     Pelvic: Cervical exam deferred        Extremities: Normal range of motion.  Edema: None  Mental Status:  Normal mood and affect. Normal behavior. Normal judgment and thought content.   Assessment and Plan:  Pregnancy: G3P2002 at 8420w0d  1. Supervision of other normal pregnancy, antepartum     Doing well.  Taking water birth class on 01/03/17.    Preterm labor symptoms and general obstetric precautions including but not limited to vaginal bleeding, contractions, leaking of fluid and fetal movement were reviewed in detail with the patient. Please refer to After Visit Summary for other counseling recommendations.  Return in about 4 weeks  (around 01/16/2017) for ROB.   Roe Coombsachelle A Brecklyn Galvis, CNM

## 2016-12-19 NOTE — Progress Notes (Signed)
Patient reports no concerns today 

## 2016-12-26 ENCOUNTER — Encounter: Payer: Self-pay | Admitting: *Deleted

## 2017-01-16 ENCOUNTER — Ambulatory Visit (INDEPENDENT_AMBULATORY_CARE_PROVIDER_SITE_OTHER): Payer: Medicaid Other | Admitting: Certified Nurse Midwife

## 2017-01-16 ENCOUNTER — Other Ambulatory Visit (HOSPITAL_COMMUNITY)
Admission: RE | Admit: 2017-01-16 | Discharge: 2017-01-16 | Disposition: A | Discharge status: Home / Self Care | Payer: Medicaid Other | Source: Ambulatory Visit | Attending: Certified Nurse Midwife | Admitting: Certified Nurse Midwife

## 2017-01-16 VITALS — BP 117/81 | HR 100 | Wt 172.0 lb

## 2017-01-16 DIAGNOSIS — Z3480 Encounter for supervision of other normal pregnancy, unspecified trimester: Secondary | ICD-10-CM

## 2017-01-16 DIAGNOSIS — Z3483 Encounter for supervision of other normal pregnancy, third trimester: Secondary | ICD-10-CM

## 2017-01-16 NOTE — Progress Notes (Signed)
   PRENATAL VISIT NOTE  Subjective:  Amy Jacobson is a 31 y.o. G3P2002 at 4576w0d being seen today for ongoing prenatal care.  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  Patient reports no complaints.  Contractions: Irregular. Vag. Bleeding: None.  Movement: Present. Denies leaking of fluid.   The following portions of the patient's history were reviewed and updated as appropriate: allergies, current medications, past family history, past medical history, past social history, past surgical history and problem list. Problem list updated.  Objective:   Vitals:   01/16/17 0843  BP: 117/81  Pulse: 100  Weight: 172 lb (78 kg)    Fetal Status: Fetal Heart Rate (bpm): 158; doppler Fundal Height: 36 cm Movement: Present  Presentation: Vertex  General:  Alert, oriented and cooperative. Patient is in no acute distress.  Skin: Skin is warm and dry. No rash noted.   Cardiovascular: Normal heart rate noted  Respiratory: Normal respiratory effort, no problems with respiration noted  Abdomen: Soft, gravid, appropriate for gestational age.  Pain/Pressure: Present     Pelvic: Cervical exam performed Dilation: 1 Effacement (%): Thick Station: Ballotable  Extremities: Normal range of motion.     Mental Status:  Normal mood and affect. Normal behavior. Normal judgment and thought content.   Assessment and Plan:  Pregnancy: G3P2002 at 2776w0d  1. Supervision of other normal pregnancy, antepartum      Doing welll.  Desires to stay on baby scripts, was having trouble with equipment synching. Waterbirth planned, has Lobbyistdoula/tub manager.    - Culture, beta strep (group b only) - Cervicovaginal ancillary only  Preterm labor symptoms and general obstetric precautions including but not limited to  vaginal bleeding, contractions, leaking of fluid and fetal movement were reviewed in detail with the patient. Please refer to After Visit Summary for other counseling recommendations.  Return in about 2 weeks (around 01/30/2017) for ROB, babyscripts.   Roe Coombsachelle A Cloie Wooden, CNM

## 2017-01-18 LAB — CERVICOVAGINAL ANCILLARY ONLY
BACTERIAL VAGINITIS: NEGATIVE
CANDIDA VAGINITIS: NEGATIVE
Chlamydia: NEGATIVE
Neisseria Gonorrhea: NEGATIVE
Trichomonas: NEGATIVE

## 2017-01-19 LAB — CULTURE, BETA STREP (GROUP B ONLY): STREP GP B CULTURE: POSITIVE — AB

## 2017-01-24 ENCOUNTER — Other Ambulatory Visit: Payer: Self-pay | Admitting: Certified Nurse Midwife

## 2017-01-24 DIAGNOSIS — B951 Streptococcus, group B, as the cause of diseases classified elsewhere: Secondary | ICD-10-CM

## 2017-01-24 DIAGNOSIS — Z3480 Encounter for supervision of other normal pregnancy, unspecified trimester: Secondary | ICD-10-CM

## 2017-01-30 ENCOUNTER — Ambulatory Visit (INDEPENDENT_AMBULATORY_CARE_PROVIDER_SITE_OTHER): Payer: Medicaid Other | Admitting: Certified Nurse Midwife

## 2017-01-30 ENCOUNTER — Encounter: Payer: Self-pay | Admitting: Certified Nurse Midwife

## 2017-01-30 VITALS — BP 123/83 | HR 101 | Wt 177.0 lb

## 2017-01-30 DIAGNOSIS — Z3480 Encounter for supervision of other normal pregnancy, unspecified trimester: Secondary | ICD-10-CM

## 2017-01-30 DIAGNOSIS — B951 Streptococcus, group B, as the cause of diseases classified elsewhere: Secondary | ICD-10-CM

## 2017-01-30 NOTE — Progress Notes (Signed)
   PRENATAL VISIT NOTE  Subjective:  Amy Jacobson is a 31 y.o. G3P2002 at [redacted]w[redacted]d being seen today for ongoing prenatal care.  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  Patient reports heartburn, no bleeding, no contractions, no cramping, no leaking and OTC TUMS.  Contractions: Irritability. Vag. Bleeding: None.  Movement: Present. Denies leaking of fluid.   The following portions of the patient's history were reviewed and updated as appropriate: allergies, current medications, past family history, past medical history, past social history, past surgical history and problem list. Problem list updated.  Objective:   Vitals:   01/30/17 0829  BP: 123/83  Pulse: (!) 101  Weight: 177 lb (80.3 kg)    Fetal Status: Fetal Heart Rate (bpm): 148; doppler Fundal Height: 38 cm Movement: Present     General:  Alert, oriented and cooperative. Patient is in no acute distress.  Skin: Skin is warm and dry. No rash noted.   Cardiovascular: Normal heart rate noted  Respiratory: Normal respiratory effort, no problems with respiration noted  Abdomen: Soft, gravid, appropriate for gestational age.  Pain/Pressure: Present     Pelvic: Cervical exam deferred        Extremities: Normal range of motion.  Edema: None  Mental Status:  Normal mood and affect. Normal behavior. Normal judgment and thought content.   Assessment and Plan:  Pregnancy: G3P2002 at [redacted]w[redacted]d  1. Supervision of other normal pregnancy, antepartum     Doing well.  Waterbirth planned.   2. Positive GBS test     PCN for labor/delivery  Term labor symptoms and general obstetric precautions including but not limited to vaginal bleeding, contractions, leaking of fluid and fetal movement were reviewed in detail  with the patient. Please refer to After Visit Summary for other counseling recommendations.  Return in about 1 week (around 02/06/2017) for ROB.   Roe Coombs, CNM

## 2017-02-08 ENCOUNTER — Ambulatory Visit (INDEPENDENT_AMBULATORY_CARE_PROVIDER_SITE_OTHER): Payer: Medicaid Other | Admitting: Certified Nurse Midwife

## 2017-02-08 ENCOUNTER — Encounter: Payer: Self-pay | Admitting: Certified Nurse Midwife

## 2017-02-08 VITALS — BP 118/78 | HR 109 | Wt 179.2 lb

## 2017-02-08 DIAGNOSIS — B951 Streptococcus, group B, as the cause of diseases classified elsewhere: Secondary | ICD-10-CM

## 2017-02-08 DIAGNOSIS — Z3480 Encounter for supervision of other normal pregnancy, unspecified trimester: Secondary | ICD-10-CM

## 2017-02-08 DIAGNOSIS — Z3483 Encounter for supervision of other normal pregnancy, third trimester: Secondary | ICD-10-CM

## 2017-02-08 NOTE — Progress Notes (Signed)
   PRENATAL VISIT NOTE  Subjective:  Amy Jacobson is a 31 y.o. G3P2002 at [redacted]w[redacted]d being seen today for ongoing prenatal care.  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  Patient reports no complaints.  Contractions: Irregular. Vag. Bleeding: None.  Movement: Present. Denies leaking of fluid.   The following portions of the patient's history were reviewed and updated as appropriate: allergies, current medications, past family history, past medical history, past social history, past surgical history and problem list. Problem list updated.  Objective:   Vitals:   02/08/17 0851  BP: 118/78  Pulse: (!) 109  Weight: 179 lb 3.2 oz (81.3 kg)    Fetal Status: Fetal Heart Rate (bpm): 155; doppler Fundal Height: 37 cm Movement: Present     General:  Alert, oriented and cooperative. Patient is in no acute distress.  Skin: Skin is warm and dry. No rash noted.   Cardiovascular: Normal heart rate noted  Respiratory: Normal respiratory effort, no problems with respiration noted  Abdomen: Soft, gravid, appropriate for gestational age.  Pain/Pressure: Present     Pelvic: Cervical exam deferred        Extremities: Normal range of motion.  Edema: None  Mental Status:  Normal mood and affect. Normal behavior. Normal judgment and thought content.   Assessment and Plan:  Pregnancy: G3P2002 at [redacted]w[redacted]d  1. Supervision of other normal pregnancy, antepartum     Doing well.    2. Positive GBS test      PCN for labor/delivery  Term labor symptoms and general obstetric precautions including but not limited to vaginal bleeding, contractions, leaking of fluid and fetal movement were reviewed in detail with the patient. Please refer to After Visit Summary for other counseling  recommendations.  Return in about 1 week (around 02/15/2017) for ROB, NST.   Roe Coombs, CNM

## 2017-02-12 ENCOUNTER — Telehealth (HOSPITAL_COMMUNITY): Payer: Self-pay | Admitting: *Deleted

## 2017-02-12 NOTE — Telephone Encounter (Signed)
Preadmission screen  

## 2017-02-15 ENCOUNTER — Encounter: Payer: Medicaid Other | Admitting: Certified Nurse Midwife

## 2017-02-16 ENCOUNTER — Inpatient Hospital Stay (HOSPITAL_COMMUNITY)
Admission: AD | Admit: 2017-02-16 | Discharge: 2017-02-18 | DRG: 807 | Disposition: A | Discharge status: Home / Self Care | Payer: Medicaid Other | Source: Ambulatory Visit | Attending: Obstetrics & Gynecology | Admitting: Obstetrics & Gynecology

## 2017-02-16 ENCOUNTER — Encounter (HOSPITAL_COMMUNITY): Payer: Self-pay

## 2017-02-16 DIAGNOSIS — Z3A40 40 weeks gestation of pregnancy: Secondary | ICD-10-CM

## 2017-02-16 DIAGNOSIS — O99824 Streptococcus B carrier state complicating childbirth: Secondary | ICD-10-CM | POA: Diagnosis present

## 2017-02-16 DIAGNOSIS — Z91040 Latex allergy status: Secondary | ICD-10-CM | POA: Diagnosis not present

## 2017-02-16 DIAGNOSIS — Z3480 Encounter for supervision of other normal pregnancy, unspecified trimester: Secondary | ICD-10-CM

## 2017-02-16 DIAGNOSIS — O26893 Other specified pregnancy related conditions, third trimester: Secondary | ICD-10-CM | POA: Diagnosis present

## 2017-02-16 LAB — TYPE AND SCREEN
ABO/RH(D): A POS
ANTIBODY SCREEN: NEGATIVE

## 2017-02-16 LAB — CBC
HCT: 32.9 % — ABNORMAL LOW (ref 36.0–46.0)
HEMOGLOBIN: 10.8 g/dL — AB (ref 12.0–15.0)
MCH: 28.3 pg (ref 26.0–34.0)
MCHC: 32.8 g/dL (ref 30.0–36.0)
MCV: 86.1 fL (ref 78.0–100.0)
PLATELETS: 233 10*3/uL (ref 150–400)
RBC: 3.82 MIL/uL — AB (ref 3.87–5.11)
RDW: 14.6 % (ref 11.5–15.5)
WBC: 12.4 10*3/uL — AB (ref 4.0–10.5)

## 2017-02-16 MED ORDER — LACTATED RINGERS IV SOLN: 500.0000 mL | INTRAVENOUS | Status: DC | PRN

## 2017-02-16 MED ORDER — COCONUT OIL OIL
1.0000 "application " | TOPICAL_OIL | Status: DC | PRN
  Administered 2017-02-17: 1 via TOPICAL
  Filled 2017-02-16: qty 120

## 2017-02-16 MED ORDER — LIDOCAINE HCL (PF) 1 % IJ SOLN
30.0000 mL | INTRAMUSCULAR | Status: DC | PRN
  Filled 2017-02-16: qty 30

## 2017-02-16 MED ORDER — TETANUS-DIPHTH-ACELL PERTUSSIS 5-2.5-18.5 LF-MCG/0.5 IM SUSP: 0.5000 mL | Freq: Once | INTRAMUSCULAR | Status: DC

## 2017-02-16 MED ORDER — OXYCODONE-ACETAMINOPHEN 5-325 MG PO TABS: 1.0000 | ORAL_TABLET | ORAL | Status: DC | PRN

## 2017-02-16 MED ORDER — OXYTOCIN 10 UNIT/ML IJ SOLN
INTRAMUSCULAR | Status: AC
  Filled 2017-02-16: qty 1

## 2017-02-16 MED ORDER — OXYCODONE-ACETAMINOPHEN 5-325 MG PO TABS: 2.0000 | ORAL_TABLET | ORAL | Status: DC | PRN

## 2017-02-16 MED ORDER — ONDANSETRON HCL 4 MG PO TABS: 4.0000 mg | ORAL_TABLET | ORAL | Status: DC | PRN

## 2017-02-16 MED ORDER — MEASLES, MUMPS & RUBELLA VAC ~~LOC~~ INJ: 0.5000 mL | INJECTION | Freq: Once | SUBCUTANEOUS | Status: DC

## 2017-02-16 MED ORDER — METHYLERGONOVINE MALEATE 0.2 MG/ML IJ SOLN: 0.2000 mg | INTRAMUSCULAR | Status: DC | PRN

## 2017-02-16 MED ORDER — OXYTOCIN 10 UNIT/ML IJ SOLN
10.0000 [IU] | Freq: Once | INTRAMUSCULAR | Status: AC
  Administered 2017-02-16: 10 [IU] via INTRAMUSCULAR

## 2017-02-16 MED ORDER — SIMETHICONE 80 MG PO CHEW: 80.0000 mg | CHEWABLE_TABLET | ORAL | Status: DC | PRN

## 2017-02-16 MED ORDER — LACTATED RINGERS IV SOLN: INTRAVENOUS | Status: DC

## 2017-02-16 MED ORDER — ONDANSETRON HCL 4 MG/2ML IJ SOLN: 4.0000 mg | INTRAMUSCULAR | Status: DC | PRN

## 2017-02-16 MED ORDER — OXYTOCIN BOLUS FROM INFUSION: 500.0000 mL | Freq: Once | INTRAVENOUS | Status: DC

## 2017-02-16 MED ORDER — FLEET ENEMA 7-19 GM/118ML RE ENEM: 1.0000 | ENEMA | RECTAL | Status: DC | PRN

## 2017-02-16 MED ORDER — SOD CITRATE-CITRIC ACID 500-334 MG/5ML PO SOLN: 30.0000 mL | ORAL | Status: DC | PRN

## 2017-02-16 MED ORDER — DOCUSATE SODIUM 100 MG PO CAPS
100.0000 mg | ORAL_CAPSULE | Freq: Two times a day (BID) | ORAL | Status: DC
  Administered 2017-02-17 (×2): 100 mg via ORAL
  Filled 2017-02-16 (×2): qty 1

## 2017-02-16 MED ORDER — OXYTOCIN 40 UNITS IN LACTATED RINGERS INFUSION - SIMPLE MED: 2.5000 [IU]/h | INTRAVENOUS | Status: DC

## 2017-02-16 MED ORDER — FENTANYL CITRATE (PF) 100 MCG/2ML IJ SOLN: 50.0000 ug | INTRAMUSCULAR | Status: DC | PRN

## 2017-02-16 MED ORDER — FERROUS SULFATE 325 (65 FE) MG PO TABS
325.0000 mg | ORAL_TABLET | Freq: Two times a day (BID) | ORAL | Status: DC
  Administered 2017-02-16 – 2017-02-17 (×3): 325 mg via ORAL
  Filled 2017-02-16 (×3): qty 1

## 2017-02-16 MED ORDER — ACETAMINOPHEN 325 MG PO TABS: 650.0000 mg | ORAL_TABLET | ORAL | Status: DC | PRN

## 2017-02-16 MED ORDER — SENNOSIDES-DOCUSATE SODIUM 8.6-50 MG PO TABS
2.0000 | ORAL_TABLET | ORAL | Status: DC
  Administered 2017-02-17 (×2): 2 via ORAL
  Filled 2017-02-16 (×2): qty 2

## 2017-02-16 MED ORDER — ONDANSETRON HCL 4 MG/2ML IJ SOLN: 4.0000 mg | Freq: Four times a day (QID) | INTRAMUSCULAR | Status: DC | PRN

## 2017-02-16 MED ORDER — IBUPROFEN 600 MG PO TABS
600.0000 mg | ORAL_TABLET | Freq: Four times a day (QID) | ORAL | Status: DC
  Administered 2017-02-16 – 2017-02-18 (×9): 600 mg via ORAL
  Filled 2017-02-16 (×9): qty 1

## 2017-02-16 MED ORDER — SODIUM CHLORIDE 0.9 % IV SOLN
2.0000 g | Freq: Once | INTRAVENOUS | Status: DC
  Filled 2017-02-16: qty 2000

## 2017-02-16 MED ORDER — BENZOCAINE-MENTHOL 20-0.5 % EX AERO
1.0000 "application " | INHALATION_SPRAY | CUTANEOUS | Status: DC | PRN
  Filled 2017-02-16: qty 56

## 2017-02-16 MED ORDER — BISACODYL 10 MG RE SUPP: 10.0000 mg | Freq: Every day | RECTAL | Status: DC | PRN

## 2017-02-16 MED ORDER — PRENATAL MULTIVITAMIN CH
1.0000 | ORAL_TABLET | Freq: Every day | ORAL | Status: DC
  Administered 2017-02-17: 1 via ORAL
  Filled 2017-02-16: qty 1

## 2017-02-16 MED ORDER — FLEET ENEMA 7-19 GM/118ML RE ENEM: 1.0000 | ENEMA | Freq: Every day | RECTAL | Status: DC | PRN

## 2017-02-16 MED ORDER — METHYLERGONOVINE MALEATE 0.2 MG PO TABS: 0.2000 mg | ORAL_TABLET | ORAL | Status: DC | PRN

## 2017-02-16 NOTE — MAU Note (Signed)
Presents with CTX since 0100.  No LOF/VB.  Reports good FM.  Waterbirth-has taken classes and has a doula.  GBS +.  Reports hx of fast labors.
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
## 2017-02-17 LAB — RPR: RPR Ser Ql: NONREACTIVE

## 2017-02-17 NOTE — Progress Notes (Signed)
MOB was referred for history of depression/anxiety.  Referral is screened out by Clinical Social Worker because none of the following criteria appear to apply and there are no reports impacting the pregnancy or her transition to the postpartum period.  CSW does not deem it clinically necessary to further investigate at this time.   -History of anxiety/depression during this pregnancy, or of post-partum depression.  - Diagnosis of anxiety and/or depression within last 3 years.-  - History of depression due to pregnancy loss/loss of child or -MOB's symptoms are currently being treated with medication and/or therapy.  Please contact the Clinical Social Worker if needs arise or upon MOB request.    Abdulah Iqbal, MSW, LCSW-A Clinical Social Worker  [facility]  Office: [phone]   

## 2017-02-17 NOTE — Progress Notes (Signed)
Post Partum Day #1 Subjective: no complaints, up ad lib and tolerating PO; breastfeeding going well; NFP for contraception; would like to go home today- she will ask Peds (no GBS ppx)  Objective: Blood pressure 113/70, pulse 82, temperature 98.4 F (36.9 C), temperature source Oral, resp. rate 16, height  (1.549 m), weight 82.6 kg (182 lb), last menstrual period 05/09/2016, unknown if currently breastfeeding.  Physical Exam:  General: alert, cooperative and no distress Lochia: appropriate Uterine Fundus: firm DVT Evaluation: No evidence of DVT seen on physical exam.   Recent Labs  02/16/17 1336  HGB 10.8*  HCT 32.9*    Assessment/Plan: Plan for discharge tomorrow unless baby can go today- will let us know   LOS: 1 day   Allyse Fregeau CNM 02/17/2017, 7:08 AM

## 2017-02-18 MED ORDER — IBUPROFEN 600 MG PO TABS: 600.0000 mg | ORAL_TABLET | Freq: Four times a day (QID) | ORAL | 0 refills | Status: DC

## 2017-02-18 MED ORDER — PRENATAL MULTIVITAMIN CH
1.0000 | ORAL_TABLET | Freq: Every day | ORAL | Status: DC
  Administered 2017-02-18: 1 via ORAL
  Filled 2017-02-18: qty 1

## 2017-02-18 MED ORDER — WITCH HAZEL-GLYCERIN EX PADS: 1.0000 "application " | MEDICATED_PAD | CUTANEOUS | Status: DC | PRN

## 2017-02-18 MED ORDER — DIBUCAINE 1 % RE OINT: 1.0000 "application " | TOPICAL_OINTMENT | RECTAL | Status: DC | PRN

## 2017-02-18 MED ORDER — IBUPROFEN 600 MG PO TABS: 600.0000 mg | ORAL_TABLET | Freq: Four times a day (QID) | ORAL | Status: DC

## 2017-02-18 MED ORDER — SIMETHICONE 80 MG PO CHEW: 80.0000 mg | CHEWABLE_TABLET | ORAL | Status: DC | PRN

## 2017-02-18 MED ORDER — DIPHENHYDRAMINE HCL 25 MG PO CAPS: 25.0000 mg | ORAL_CAPSULE | Freq: Four times a day (QID) | ORAL | Status: DC | PRN

## 2017-02-18 MED ORDER — TETANUS-DIPHTH-ACELL PERTUSSIS 5-2.5-18.5 LF-MCG/0.5 IM SUSP: 0.5000 mL | Freq: Once | INTRAMUSCULAR | Status: DC

## 2017-02-18 MED ORDER — ONDANSETRON HCL 4 MG PO TABS: 4.0000 mg | ORAL_TABLET | ORAL | Status: DC | PRN

## 2017-02-18 MED ORDER — ONDANSETRON HCL 4 MG/2ML IJ SOLN: 4.0000 mg | INTRAMUSCULAR | Status: DC | PRN

## 2017-02-18 MED ORDER — ACETAMINOPHEN 325 MG PO TABS: 650.0000 mg | ORAL_TABLET | ORAL | Status: DC | PRN

## 2017-02-18 MED ORDER — ZOLPIDEM TARTRATE 5 MG PO TABS: 5.0000 mg | ORAL_TABLET | Freq: Every evening | ORAL | Status: DC | PRN

## 2017-02-18 MED ORDER — SENNOSIDES-DOCUSATE SODIUM 8.6-50 MG PO TABS: 2.0000 | ORAL_TABLET | ORAL | Status: DC

## 2017-02-18 MED ORDER — COCONUT OIL OIL: 1.0000 "application " | TOPICAL_OIL | Status: DC | PRN

## 2017-02-18 MED ORDER — BENZOCAINE-MENTHOL 20-0.5 % EX AERO: 1.0000 "application " | INHALATION_SPRAY | CUTANEOUS | Status: DC | PRN

## 2017-02-18 NOTE — Discharge Instructions (Signed)
Home Care Instructions for Mom °ACTIVITY °· Gradually return to your regular activities. °· Let yourself rest. Nap while your baby sleeps. °· Avoid lifting anything that is heavier than 10 lb (4.5 kg) until your health care provider says it is okay. °· Avoid activities that take a lot of effort and energy (are strenuous) until approved by your health care provider. Walking at a slow-to-moderate pace is usually safe. °· If you had a cesarean delivery: °? Do not vacuum, climb stairs, or drive a car for 4-6 weeks. °? Have someone help you at home until you feel like you can do your usual activities yourself. °? Do exercises as told by your health care provider, if this applies. ° °VAGINAL BLEEDING °You may continue to bleed for 4-6 weeks after delivery. Over time, the amount of blood usually decreases and the color of the blood usually gets lighter. However, the flow of bright red blood may increase if you have been too active. If you need to use more than one pad in an hour because your pad gets soaked, or if you pass a large clot: °· Lie down. °· Raise your feet. °· Place a cold compress on your lower abdomen. °· Rest. °· Call your health care provider. ° °If you are breastfeeding, your period should return anytime between 8 weeks after delivery and the time that you stop breastfeeding. If you are not breastfeeding, your period should return 6-8 weeks after delivery. °PERINEAL CARE °The perineal area, or perineum, is the part of your body between your thighs. After delivery, this area needs special care. Follow these instructions as told by your health care provider. °· Take warm tub baths for 15-20 minutes. °· Use medicated pads and pain-relieving sprays and creams as told. °· Do not use tampons or douches until vaginal bleeding has stopped. °· Each time you go to the bathroom: °? Use a peri bottle. °? Change your pad. °? Use towelettes in place of toilet paper until your stitches have healed. °· Do Kegel exercises  every day. Kegel exercises help to maintain the muscles that support the vagina, bladder, and bowels. You can do these exercises while you are standing, sitting, or lying down. To do Kegel exercises: °? Tighten the muscles of your abdomen and the muscles that surround your birth canal. °? Hold for a few seconds. °? Relax. °? Repeat until you have done this 5 times in a row. °· To prevent hemorrhoids from developing or getting worse: °? Drink enough fluid to keep your urine clear or pale yellow. °? Avoid straining when having a bowel movement. °? Take over-the-counter medicines and stool softeners as told by your health care provider. ° °BREAST CARE °· Wear a tight-fitting bra. °· Avoid taking over-the-counter pain medicine for breast discomfort. °· Apply ice to the breasts to help with discomfort as needed: °? Put ice in a plastic bag. °? Place a towel between your skin and the bag. °? Leave the ice on for 20 minutes or as told by your health care provider. ° °NUTRITION °· Eat a well-balanced diet. °· Do not try to lose weight quickly by cutting back on calories. °· Take your prenatal vitamins until your postpartum checkup or until your health care provider tells you to stop. ° °POSTPARTUM DEPRESSION °You may find yourself crying for no apparent reason and unable to cope with all of the changes that come with having a newborn. This mood is called postpartum depression. Postpartum depression happens because your hormone   levels change after delivery. If you have postpartum depression, get support from your partner, friends, and family. If the depression does not go away on its own after several weeks, contact your health care provider. BREAST SELF-EXAM Do a breast self-exam each month, at the same time of the month. If you are breastfeeding, check your breasts just after a feeding, when your breasts are less full. If you are breastfeeding and your period has started, check your breasts on day 5, 6, or 7 of your  period. Report any lumps, bumps, or discharge to your health care provider. Know that breasts are normally lumpy if you are breastfeeding. This is temporary, and it is not a health risk. INTIMACY AND SEXUALITY Avoid sexual activity for at least 3-4 weeks after delivery or until the brownish-red vaginal flow is completely gone. If you want to avoid pregnancy, use some form of birth control. You can get pregnant after delivery, even if you have not had your period. SEEK MEDICAL CARE IF:  You feel unable to cope with the changes that a child brings to your life, and these feelings do not go away after several weeks.  You notice a lump, a bump, or discharge on your breast.  SEEK IMMEDIATE MEDICAL CARE IF:  Blood soaks your pad in 1 hour or less.  You have: ? Severe pain or cramping in your lower abdomen. ? A bad-smelling vaginal discharge. ? A fever that is not controlled by medicine. ? A fever, and an area of your breast is red and sore. ? Pain or redness in your calf. ? Sudden, severe chest pain. ? Shortness of breath. ? Painful or bloody urination. ? Problems with your vision.  You vomit for 12 hours or longer.  You develop a severe headache.  You have serious thoughts about hurting yourself, your child, or anyone else.  This information is not intended to replace advice given to you by your health care provider. Make sure you discuss any questions you have with your health care provider. Document Released: 04/28/2000 Document Revised: 10/07/2015 Document Reviewed: 11/02/2014 Elsevier Interactive Patient Education  2017 [employer]   Breastfeeding Deciding to breastfeed is one of the best choices you can make for you and your baby. A change in hormones during pregnancy causes your breast tissue to grow and increases the number and size of your milk ducts. These hormones also allow proteins, sugars, and fats from your blood supply to make breast milk in your milk-producing  glands. Hormones prevent breast milk from being released before your baby is born as well as prompt milk flow after birth. Once breastfeeding has begun, thoughts of your baby, as well as his or her sucking or crying, can stimulate the release of milk from your milk-producing glands. Benefits of breastfeeding For Your Baby  Your first milk (colostrum) helps your baby's digestive system function better.  There are antibodies in your milk that help your baby fight off infections.  Your baby has a lower incidence of asthma, allergies, and sudden infant death syndrome.  The nutrients in breast milk are better for your baby than infant formulas and are designed uniquely for your babys needs.  Breast milk improves your baby's brain development.  Your baby is less likely to develop other conditions, such as childhood obesity, asthma, or type 2 diabetes mellitus.  For You  Breastfeeding helps to create a very special bond between you and your baby.  Breastfeeding is convenient. Breast milk is always available at the  correct temperature and costs nothing.  Breastfeeding helps to burn calories and helps you lose the weight gained during pregnancy.  Breastfeeding makes your uterus contract to its prepregnancy size faster and slows bleeding (lochia) after you give birth.  Breastfeeding helps to lower your risk of developing type 2 diabetes mellitus, osteoporosis, and breast or ovarian cancer later in life.  Signs that your baby is hungry Early Signs of Hunger  Increased alertness or activity.  Stretching.  Movement of the head from side to side.  Movement of the head and opening of the mouth when the corner of the mouth or cheek is stroked (rooting).  Increased sucking sounds, smacking lips, cooing, sighing, or squeaking.  Hand-to-mouth movements.  Increased sucking of fingers or hands.  Late Signs of Hunger  Fussing.  Intermittent crying.  Extreme Signs of Hunger Signs of  extreme hunger will require calming and consoling before your baby will be able to breastfeed successfully. Do not wait for the following signs of extreme hunger to occur before you initiate breastfeeding:  Restlessness.  A loud, strong cry.  Screaming.  Breastfeeding basics Breastfeeding Initiation  Find a comfortable place to sit or lie down, with your neck and back well supported.  Place a pillow or rolled up blanket under your baby to bring him or her to the level of your breast (if you are seated). Nursing pillows are specially designed to help support your arms and your baby while you breastfeed.  Make sure that your baby's abdomen is facing your abdomen.  Gently massage your breast. With your fingertips, massage from your chest wall toward your nipple in a circular motion. This encourages milk flow. You may need to continue this action during the feeding if your milk flows slowly.  Support your breast with 4 fingers underneath and your thumb above your nipple. Make sure your fingers are well away from your nipple and your babys mouth.  Stroke your baby's lips gently with your finger or nipple.  When your baby's mouth is open wide enough, quickly bring your baby to your breast, placing your entire nipple and as much of the colored area around your nipple (areola) as possible into your baby's mouth. ? More areola should be visible above your baby's upper lip than below the lower lip. ? Your baby's tongue should be between his or her lower gum and your breast.  Ensure that your baby's mouth is correctly positioned around your nipple (latched). Your baby's lips should create a seal on your breast and be turned out (everted).  It is common for your baby to suck about 2-3 minutes in order to start the flow of breast milk.  Latching Teaching your baby how to latch on to your breast properly is very important. An improper latch can cause nipple pain and decreased milk supply for you  and poor weight gain in your baby. Also, if your baby is not latched onto your nipple properly, he or she may swallow some air during feeding. This can make your baby fussy. Burping your baby when you switch breasts during the feeding can help to get rid of the air. However, teaching your baby to latch on properly is still the best way to prevent fussiness from swallowing air while breastfeeding. Signs that your baby has successfully latched on to your nipple:  Silent tugging or silent sucking, without causing you pain.  Swallowing heard between every 3-4 sucks.  Muscle movement above and in front of his or her ears  while sucking.  Signs that your baby has not successfully latched on to nipple:  Sucking sounds or smacking sounds from your baby while breastfeeding.  Nipple pain.  If you think your baby has not latched on correctly, slip your finger into the corner of your babys mouth to break the suction and place it between your baby's gums. Attempt breastfeeding initiation again. Signs of Successful Breastfeeding Signs from your baby:  A gradual decrease in the number of sucks or complete cessation of sucking.  Falling asleep.  Relaxation of his or her body.  Retention of a small amount of milk in his or her mouth.  Letting go of your breast by himself or herself.  Signs from you:  Breasts that have increased in firmness, weight, and size 1-3 hours after feeding.  Breasts that are softer immediately after breastfeeding.  Increased milk volume, as well as a change in milk consistency and color by the fifth day of breastfeeding.  Nipples that are not sore, cracked, or bleeding.  Signs That Your Pecola LeisureBaby is Getting Enough Milk  Wetting at least 1-2 diapers during the first 24 hours after birth.  Wetting at least 5-6 diapers every 24 hours for the first week after birth. The urine should be clear or pale yellow by 5 days after birth.  Wetting 6-8 diapers every 24 hours as your  baby continues to grow and develop.  At least 3 stools in a 24-hour period by age 31 days. The stool should be soft and yellow.  At least 3 stools in a 24-hour period by age 67 days. The stool should be seedy and yellow.  No loss of weight greater than 10% of birth weight during the first 1053 days of age.  Average weight gain of 4-7 ounces (113-198 g) per week after age 4 days.  Consistent daily weight gain by age 31 days, without weight loss after the age of 2 weeks.  After a feeding, your baby may spit up a small amount. This is common. Breastfeeding frequency and duration Frequent feeding will help you make more milk and can prevent sore nipples and breast engorgement. Breastfeed when you feel the need to reduce the fullness of your breasts or when your baby shows signs of hunger. This is called "breastfeeding on demand." Avoid introducing a pacifier to your baby while you are working to establish breastfeeding (the first 4-6 weeks after your baby is born). After this time you may choose to use a pacifier. Research has shown that pacifier use during the first year of a baby's life decreases the risk of sudden infant death syndrome (SIDS). Allow your baby to feed on each breast as long as he or she wants. Breastfeed until your baby is finished feeding. When your baby unlatches or falls asleep while feeding from the first breast, offer the second breast. Because newborns are often sleepy in the first few weeks of life, you may need to awaken your baby to get him or her to feed. Breastfeeding times will vary from baby to baby. However, the following rules can serve as a guide to help you ensure that your baby is properly fed:  Newborns (babies 334 weeks of age or younger) may breastfeed every 1-3 hours.  Newborns should not go longer than 3 hours during the day or 5 hours during the night without breastfeeding.  You should breastfeed your baby a minimum of 8 times in a 24-hour period until you begin  to introduce solid foods to your  baby at around 6 months of age. ° °Breast milk pumping °Pumping and storing breast milk allows you to ensure that your baby is exclusively fed your breast milk, even at times when you are unable to breastfeed. This is especially important if you are going back to work while you are still breastfeeding or when you are not able to be present during feedings. Your lactation consultant can give you guidelines on how long it is safe to store breast milk. °A breast pump is a machine that allows you to pump milk from your breast into a sterile bottle. The pumped breast milk can then be stored in a refrigerator or freezer. Some breast pumps are operated by hand, while others use electricity. Ask your lactation consultant which type will work best for you. Breast pumps can be purchased, but some hospitals and breastfeeding support groups lease breast pumps on a monthly basis. A lactation consultant can teach you how to hand express breast milk, if you prefer not to use a pump. °Caring for your breasts while you breastfeed °Nipples can become dry, cracked, and sore while breastfeeding. The following recommendations can help keep your breasts moisturized and healthy: °· Avoid using soap on your nipples. °· Wear a supportive bra. Although not required, special nursing bras and tank tops are designed to allow access to your breasts for breastfeeding without taking off your entire bra or top. Avoid wearing underwire-style bras or extremely tight bras. °· Air dry your nipples for 3-4 minutes after each feeding. °· Use only cotton bra pads to absorb leaked breast milk. Leaking of breast milk between feedings is normal. °· Use lanolin on your nipples after breastfeeding. Lanolin helps to maintain your skin's normal moisture barrier. If you use pure lanolin, you do not need to wash it off before feeding your baby again. Pure lanolin is not toxic to your baby. You may also hand express a few drops of  breast milk and gently massage that milk into your nipples and allow the milk to air dry. ° °In the first few weeks after giving birth, some women experience extremely full breasts (engorgement). Engorgement can make your breasts feel heavy, warm, and tender to the touch. Engorgement peaks within 3-5 days after you give birth. The following recommendations can help ease engorgement: °· Completely empty your breasts while breastfeeding or pumping. You may want to start by applying warm, moist heat (in the shower or with warm water-soaked hand towels) just before feeding or pumping. This increases circulation and helps the milk flow. If your baby does not completely empty your breasts while breastfeeding, pump any extra milk after he or she is finished. °· Wear a snug bra (nursing or regular) or tank top for 1-2 days to signal your body to slightly decrease milk production. °· Apply ice packs to your breasts, unless this is too uncomfortable for you. °· Make sure that your baby is latched on and positioned properly while breastfeeding. ° °If engorgement persists after 48 hours of following these recommendations, contact your health care provider or a lactation consultant. °Overall health care recommendations while breastfeeding °· Eat healthy foods. Alternate between meals and snacks, eating 3 of each per day. Because what you eat affects your breast milk, some of the foods may make your baby more irritable than usual. Avoid eating these foods if you are sure that they are negatively affecting your baby. °· Drink milk, fruit juice, and water to satisfy your thirst (about 10 glasses a day). °· Rest   often, relax, and continue to take your prenatal vitamins to prevent fatigue, stress, and anemia.  Continue breast self-awareness checks.  Avoid chewing and smoking tobacco. Chemicals from cigarettes that pass into breast milk and exposure to secondhand smoke may harm your baby.  Avoid alcohol and drug use, including  marijuana. Some medicines that may be harmful to your baby can pass through breast milk. It is important to ask your health care provider before taking any medicine, including all over-the-counter and prescription medicine as well as vitamin and herbal supplements. It is possible to become pregnant while breastfeeding. If birth control is desired, ask your health care provider about options that will be safe for your baby. Contact a health care provider if:  You feel like you want to stop breastfeeding or have become frustrated with breastfeeding.  You have painful breasts or nipples.  Your nipples are cracked or bleeding.  Your breasts are red, tender, or warm.  You have a swollen area on either breast.  You have a fever or chills.  You have nausea or vomiting.  You have drainage other than breast milk from your nipples.  Your breasts do not become full before feedings by the fifth day after you give birth.  You feel sad and depressed.  Your baby is too sleepy to eat well.  Your baby is having trouble sleeping.  Your baby is wetting less than 3 diapers in a 24-hour period.  Your baby has less than 3 stools in a 24-hour period.  Your baby's skin or the white part of his or her eyes becomes yellow.  Your baby is not gaining weight by 54 days of age. Get help right away if:  Your baby is overly tired (lethargic) and does not want to wake up and feed.  Your baby develops an unexplained fever. This information is not intended to replace advice given to you by your health care provider. Make sure you discuss any questions you have with your health care provider. Document Released: 05/01/2005 Document Revised: 10/13/2015 Document Reviewed: 10/23/2012 Elsevier Interactive Patient Education  2017 [employer].

## 2017-02-18 NOTE — Discharge Summary (Signed)
OB Discharge Summary     Patient Name: Amy Jacobson DOB: 23-Aug-1985 MRN: [id number]  Date of admission: 02/16/2017 Delivering MD: Jacklyn Shell   Date of discharge: 02/18/2017  Admitting diagnosis: 40.3wks, contractions Intrauterine pregnancy: [redacted]w[redacted]d     Secondary diagnosis:  Principal Problem:   SVD (spontaneous vaginal delivery)  Additional problems:  Patient Active Problem List   Diagnosis Date Noted  . SVD (spontaneous vaginal delivery) 02/16/2017  . Positive GBS test 01/24/2017  . Supervision of normal pregnancy, antepartum 07/20/2016  . 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 DEPRESSION, MILD 10/08/2008       Discharge diagnosis: Term Pregnancy Delivered, waterbirth                                                                                      Post partum procedures:none  Augmentation: none  Complications: None  Hospital course:  Onset of Labor With Vaginal Delivery     31 y.o. yo G3P3003 at [redacted]w[redacted]d was admitted in Active Labor on 02/16/2017. Patient had an uncomplicated labor course as follows:  Membrane Rupture Time/Date: 8:19 AM ,02/16/2017   Intrapartum Procedures: Episiotomy: None [1]                                         Lacerations:  None [1]  Patient had a delivery of a Viable infant. 02/16/2017  Information for the patient's newborn:  Aldean, Suddeth Girl Tonnya [[id number]]  Delivery Method: Vag-Spont    Pateint had an uncomplicated postpartum course.  She is ambulating, tolerating a regular diet, passing flatus, and urinating well. Patient is discharged home in stable condition on 02/18/17.   Physical exam  Vitals:   02/17/17 0235 02/17/17 0529 02/17/17 1804 02/18/17 0532  BP: 116/70 113/70 124/76 117/83  Pulse: 79 82 78 76  Resp: Temp: 99.1 F (37.3 C) 98.4 F (36.9 C) 98.5 F (36.9 C) 98.5 F (36.9 C)  TempSrc: Oral Oral Oral Oral  Weight:       Height:       General: alert, cooperative and no distress Lochia: appropriate Uterine Fundus: firm Incision: N/A DVT Evaluation: No evidence of DVT seen on physical exam. No significant calf/ankle edema. Labs: Lab Results  Component Value Date   WBC 12.4 (H) 02/16/2017   HGB 10.8 (L) 02/16/2017   HCT 32.9 (L) 02/16/2017   MCV 86.1 02/16/2017   PLT 233 02/16/2017   CMP Latest Ref Rng & Units 10/27/2014  Glucose 65 - 99 mg/dL 93  BUN 6 - 20 mg/dL 7  Creatinine 7.82 - 9.56 mg/dL 2.13  Sodium 086 - 578 mmol/L 133(L)  Potassium 3.5 - 5.1 mmol/L 3.6  Chloride 101 - 111 mmol/L 107  CO2 22 - 32 mmol/L 18(L)  Calcium 8.9 - 10.3 mg/dL 4.6(N)  Total Protein 6.5 - 8.1 g/dL 6.5  Total Bilirubin 0.3 - 1.2 mg/dL 6.2(X)  Alkaline Phos 38 - 126 U/L 151(H)  AST 15 - 41 U/L 17  ALT 14 - 54 U/L 11(L)    Discharge instruction: per After Visit Summary and "Baby and Me Booklet".  After visit meds:  Allergies as of 02/18/2017      Reactions   Benadryl [diphenhydramine] Anaphylaxis   Latex Itching   Calamine Medicated [pramoxine-calamine] Rash   Night-time Cough [doxylamine-dm] Palpitations      Medication List    TAKE these medications   ibuprofen 600 MG tablet Commonly known as:  ADVIL,MOTRIN Take 1 tablet (600 mg total) by mouth every 6 (six) hours.   prenatal multivitamin Tabs tablet Take 1 tablet by mouth daily at 12 noon.       Diet: routine diet  Activity: Advance as tolerated. Pelvic rest for 6 weeks.   Outpatient follow up: 4 weeks  Postpartum contraception: Natural Family Planning  Newborn Data: Live born female  Birth Weight: 7 lb 12.9 oz (3541 g) APGAR: 8, 9  Newborn Delivery   Birth date/time:  02/16/2017 08:20:00 Delivery type:  Vaginal, Spontaneous Delivery      Baby Feeding: Breast Disposition:home with mother   02/18/2017 Frederik Pear, MD

## 2017-02-18 NOTE — Lactation Note (Signed)
This note was copied from a baby's chart. Lactation Consultation Note  Patient Name: Amy Jacobson [id number] Date: 02/18/2017 Reason for consult: Initial assessment  Baby 51 hours old. Mom nursing baby in cradle position on left breast when this LC entered room. Mom denies any need for assistance reporting that she nursed her first child for 3 years and her second child until her milk "dried up" during this pregnancy. Mom reports that she may attempt to tandem nurse if second child interested. Mom aware of OP/BFSG and LC phone line assistance after D/C.   Maternal Data Has patient been taught Hand Expression?: Yes Does the patient have breastfeeding experience prior to this delivery?: Yes  Feeding    LATCH Score                   Interventions    Lactation Tools Discussed/Used     Consult Status Consult Status: Complete    [name] 02/18/2017, 11:42 AM

## 2017-02-20 ENCOUNTER — Inpatient Hospital Stay (HOSPITAL_COMMUNITY): Admission: RE | Admit: 2017-02-20 | Payer: No Typology Code available for payment source | Source: Ambulatory Visit

## 2017-03-15 ENCOUNTER — Ambulatory Visit (INDEPENDENT_AMBULATORY_CARE_PROVIDER_SITE_OTHER): Payer: Medicaid Other | Admitting: Obstetrics and Gynecology

## 2017-03-15 DIAGNOSIS — Z1389 Encounter for screening for other disorder: Secondary | ICD-10-CM | POA: Diagnosis not present

## 2017-03-15 DIAGNOSIS — O9279 Other disorders of lactation: Secondary | ICD-10-CM

## 2017-03-15 NOTE — Progress Notes (Signed)
Pt presents for postpartum. Is using natural supplements to ease thrush and clogged duct L breast. Pt is bfeeding. Natural [facility]. Is sexually active. Normal pap 07/20/16.

## 2017-03-15 NOTE — Progress Notes (Signed)
Obstetrics/Postpartum Visit  Appointment Date: 03/15/2017  OBGYN Clinic: [facility]  Primary Care Provider: Patient, No Pcp Per  Chief Complaint:  Chief Complaint  Patient presents with  . Postpartum Care    History of Present Illness: Amy Jacobson is a 31 y.o. African-American G3P3003 (No LMP recorded.), seen for the above chief complaint. Her past medical history is significant for none.  She is s/p SVD on 02/16/17; she was discharged to home on 02/18/17  Vaginal bleeding or discharge: No  Breast or formula feeding: is breastfeeding Intercourse: Yes  Contraception after delivery: natural family planning PP depression s/s: No  Any bowel or bladder issues: No  Pap smear: no abnormalities (date: 07/30/16)  Review of Systems: Positive for nothing.   Her 12 point review of systems is negative or as noted in the History of Present Illness.  Patient Active Problem List   Diagnosis Date Noted  . SVD (spontaneous vaginal delivery) 02/16/2017  . Positive GBS test 01/24/2017  . Supervision of normal pregnancy, antepartum 07/20/2016  . 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 DEPRESSION, MILD 10/08/2008    Medications Ms. Sigal had no medications administered during this visit. Current Outpatient Prescriptions  Medication Sig Dispense Refill  . OVER THE COUNTER MEDICATION Sunflower Lecithin    . Prenatal Vit-Fe Fumarate-FA (PRENATAL MULTIVITAMIN) TABS tablet Take 1 tablet by mouth daily at 12 noon.    . Probiotic Product (PROBIOTIC DAILY PO) Take by mouth.    Marland Kitchen ibuprofen (ADVIL,MOTRIN) 600 MG tablet Take 1 tablet (600 mg total) by mouth every 6 (six) hours. (Patient not taking: Reported on 03/15/2017) 60 tablet 0   No current facility-administered medications for this visit.     Allergies Benadryl [diphenhydramine]; Latex; Calamine medicated [pramoxine-calamine]; and Night-time cough  [doxylamine-dm]  Physical Exam:  BP 136/85   Pulse 90   Ht 5\' 1"  (1.549 m)   Wt 170 lb (77.1 kg)   Breastfeeding? Yes   BMI 32.12 kg/m  Body mass index is 32.12 kg/m. BP 136/85   Pulse 90   Ht 5\' 1"  (1.549 m)   Wt 170 lb (77.1 kg)   Breastfeeding? Yes   BMI 32.12 kg/m  CONSTITUTIONAL: Well-developed, well-nourished female in no acute distress.  HENT:  Normocephalic, atraumatic, External right and left ear normal. Oropharynx is clear and moist EYES: Conjunctivae and EOM are normal. Pupils are equal, round, and reactive to light. No scleral icterus.  NECK: Normal range of motion  SKIN: Skin is warm and dry. No rash noted. Not diaphoretic. No erythema. No pallor. NEUROLOGIC: Alert and oriented to person, place, and time. Normal reflexes, muscle tone coordination. No cranial nerve deficit noted. PSYCHIATRIC: Normal mood and affect. Normal behavior. Normal judgment and thought content. CARDIOVASCULAR:Normal heart rate noted RESPIRATORY: Effort normal, no problems with respiration noted. BREASTS: Symmetric in size. No masses, skin changes, nipple drainage, or lymphadenopathy. One clogged duct in left breast, inner lower quadrant, no erythema, point tenderness or cellulitis noted ABDOMEN: no distention noted  PELVIC: deferred MUSCULOSKELETAL: Normal range of motion. No tenderness.  No cyanosis, clubbing, or edema.     PP Depression Screening:  negative  Assessment: Patient is a 31 y.o. [id number] who is 4 weeks post partum from a SVD. She is doing exceptionally well with no acute issues. Has clogged duct that she is trying to resolve with feeding and warm compresses, to call if worsens. Natural family planning for contraception, declines  contraception. Baby is doing well. Good support at home.  Plan: Return to office prn   RTC 07/2017 for annual or prn   K. Therese SarahMeryl Angeleen Horney, M.D. Attending Obstetrician & Gynecologist, [facility], [facility]

## 2018-02-23 ENCOUNTER — Other Ambulatory Visit (HOSPITAL_COMMUNITY)
Admission: RE | Admit: 2018-02-23 | Payer: Medicaid Other | Source: Ambulatory Visit | Admitting: Obstetrics & Gynecology

## 2018-04-25 ENCOUNTER — Encounter: Payer: Self-pay | Admitting: Obstetrics & Gynecology

## 2018-04-25 ENCOUNTER — Other Ambulatory Visit (HOSPITAL_COMMUNITY)
Admission: RE | Admit: 2018-04-25 | Discharge: 2018-04-25 | Disposition: A | Discharge status: Home / Self Care | Payer: Medicaid Other | Source: Ambulatory Visit | Attending: Obstetrics & Gynecology | Admitting: Obstetrics & Gynecology

## 2018-04-25 ENCOUNTER — Ambulatory Visit (INDEPENDENT_AMBULATORY_CARE_PROVIDER_SITE_OTHER): Payer: Medicaid Other | Admitting: Obstetrics & Gynecology

## 2018-04-25 DIAGNOSIS — Z3481 Encounter for supervision of other normal pregnancy, first trimester: Secondary | ICD-10-CM | POA: Diagnosis not present

## 2018-04-25 DIAGNOSIS — Z3480 Encounter for supervision of other normal pregnancy, unspecified trimester: Secondary | ICD-10-CM

## 2018-04-25 DIAGNOSIS — Z8759 Personal history of other complications of pregnancy, childbirth and the puerperium: Secondary | ICD-10-CM | POA: Insufficient documentation

## 2018-04-25 NOTE — Progress Notes (Signed)
  Subjective:    Amy Jacobson is being seen today for her first obstetrical visit.  This is a planned pregnancy. She is at 2421w6d gestation. Her obstetrical history is significant for rapid deliveries. Relationship with FOB: spouse, living together. Patient does intend to breast feed. Pregnancy history fully reviewed.  Patient reports no complaints.  Review of Systems:   Review of Systems  Objective:     BP 112/69   Pulse 85   Wt 160 lb (72.6 kg)   LMP 02/08/2018   BMI 30.23 kg/m  Physical Exam  Exam  Heart- rrr Lungs- CTAB Abd- benign    Assessment:    Pregnancy: [id number] Patient Active Problem List   Diagnosis Date Noted  . Supervision of other normal pregnancy, antepartum 04/25/2018  . H/O rapid labor 04/25/2018  . 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 DEPRESSION, MILD 10/08/2008       Plan:     Initial labs drawn. Prenatal vitamins. Problem list reviewed and updated. AFP3 discussed: declined. Role of ultrasound in pregnancy discussed; fetal survey: requested. Amniocentesis discussed: not indicated. She wants NIPS/MSAFP She wants optimized Baby scripts,  Wants another tub birth Declines flu vaccine Come back at 12 weeks   Amy BossierMyra C Roselene Jacobson 04/25/2018

## 2018-04-25 NOTE — Progress Notes (Signed)
Bedside U/S shows single IUP with FHT of 178 BPM and CRL measures 32.12mm  GA is 5839w1d

## 2018-04-26 LAB — OBSTETRIC PANEL
ANTIBODY SCREEN: NOT DETECTED
BASOS ABS: 22 {cells}/uL (ref 0–200)
Basophils Relative: 0.3 %
EOS ABS: 88 {cells}/uL (ref 15–500)
Eosinophils Relative: 1.2 %
HCT: 37.3 % (ref 35.0–45.0)
HEP B S AG: NONREACTIVE
Hemoglobin: 13.1 g/dL (ref 11.7–15.5)
Lymphs Abs: 1256 cells/uL (ref 850–3900)
MCH: 31.3 pg (ref 27.0–33.0)
MCHC: 35.1 g/dL (ref 32.0–36.0)
MCV: 89.2 fL (ref 80.0–100.0)
MONOS PCT: 7.6 %
MPV: 11.3 fL (ref 7.5–12.5)
NEUTROS ABS: 5380 {cells}/uL (ref 1500–7800)
Neutrophils Relative %: 73.7 %
PLATELETS: 292 10*3/uL (ref 140–400)
RBC: 4.18 10*6/uL (ref 3.80–5.10)
RDW: 12 % (ref 11.0–15.0)
RPR: NONREACTIVE
Rubella: 1.14 index
Total Lymphocyte: 17.2 %
WBC mixed population: 555 cells/uL (ref 200–950)
WBC: 7.3 10*3/uL (ref 3.8–10.8)

## 2018-04-26 LAB — URINE CYTOLOGY ANCILLARY ONLY
CHLAMYDIA, DNA PROBE: NEGATIVE
NEISSERIA GONORRHEA: NEGATIVE

## 2018-04-26 LAB — HIV ANTIBODY (ROUTINE TESTING W REFLEX): HIV: NONREACTIVE

## 2018-04-27 LAB — URINE CULTURE, OB REFLEX

## 2018-04-27 LAB — CULTURE, OB URINE

## 2018-05-10 ENCOUNTER — Ambulatory Visit (INDEPENDENT_AMBULATORY_CARE_PROVIDER_SITE_OTHER): Payer: Medicaid Other | Admitting: Certified Nurse Midwife

## 2018-05-10 VITALS — BP 113/77 | HR 88 | Wt 160.0 lb

## 2018-05-10 DIAGNOSIS — Z3482 Encounter for supervision of other normal pregnancy, second trimester: Secondary | ICD-10-CM

## 2018-05-10 DIAGNOSIS — Z3480 Encounter for supervision of other normal pregnancy, unspecified trimester: Secondary | ICD-10-CM

## 2018-05-10 NOTE — Progress Notes (Signed)
Subjective:  Amy Jacobson is a 32 y.o. [phone] at 6119w0d being seen today for ongoing prenatal care.  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  Patient reports no complaints.  Contractions: Not present. Vag. Bleeding: None.  Movement: Absent. Denies leaking of fluid.   The following portions of the patient's history were reviewed and updated as appropriate: allergies, current medications, past family history, past medical history, past social history, past surgical history and problem list. Problem list updated.  Objective:   Vitals:   05/10/18 0843  BP: 113/77  Pulse: 88  Weight: 72.6 kg    Fetal Status: Fetal Heart Rate (bpm): 159   Movement: Absent     General:  Alert, oriented and cooperative. Patient is in no acute distress.  Skin: Skin is warm and dry. No rash noted.   Cardiovascular: Normal heart rate noted  Respiratory: Normal respiratory effort, no problems with respiration noted  Abdomen: Soft, gravid, appropriate for gestational age. Pain/Pressure: Absent     Pelvic: Vag. Bleeding: None Vag D/C Character: Thin   Cervical exam deferred        Extremities: Normal range of motion.  Edema: None  Mental Status: Normal mood and affect. Normal behavior. Normal judgment and thought content.   Urinalysis:      Assessment and Plan:  Pregnancy: G4P3003 at 8619w0d  1. Supervision of other normal pregnancy, antepartum - Genetic Screening - US MFM OB COMP + 14 WK; Future  Preterm labor symptoms and general obstetric precautions including but not limited to vaginal bleeding, contractions, leaking of fluid and fetal movement were reviewed in detail with the patient. Please refer to After Visit Summary for other counseling recommendations.  Return in about 7 weeks (around  06/28/2018).   Donette LarryBhambri, Jenai Scaletta, CNM

## 2018-05-15 NOTE — L&D Delivery Note (Addendum)
Patient: Amy Jacobson MRN: [id number]  GBS status: Negative   Patient is a 33 y.o. now [id number] s/p NSVD at [redacted]w[redacted]d, who was admitted for early labor. SROM 0h 62m prior to delivery with clear fluid.    Delivery Note At 1:58 AM a viable female was delivered via SVD (Presentation: Right OA).  APGAR: 7;9; weight pending.   Placenta status: Spontaneous, intact  Cord: 3 vessel, intact with the following complications: controlled precipitous delivery    Anesthesia: None    Episiotomy: None Lacerations: None Est. Blood Loss (mL): ~100   Head delivered right OA. No nuchal cord present. Shoulder and body delivered in usual fashion. Infant with spontaneous cry, placed on mother's abdomen, dried and bulb suctioned. Cord clamped x 2 after 1-minute delay, and cut by mother. Cord blood drawn. Placenta delivered spontaneously with gentle cord traction. Fundus firm with massage and Pitocin. Perineum inspected and found to have no lacerations.  Mom to postpartum.  Baby to Couplet care / Skin to Skin.  Amy Jacobson 11/20/2018, 2:11 AM  I confirm that I have verified the information documented in the resident's note and that I have also personally reperformed the physical exam and all medical decision making activities.  I was gloved and present for entire delivery SVD without incident No difficulty with shoulders No lacerations  Seabron Spates, CNM  Please schedule this patient for Postpartum visit in: 4 weeks with the following provider: Any provider For C/S patients schedule nurse incision check in weeks 2 weeks: no Low risk pregnancy complicated by: none Delivery mode:  SVD Anticipated Birth Control:  vasectomy PP Procedures needed: none  Schedule Integrated BH visit: no

## 2018-05-20 ENCOUNTER — Telehealth: Payer: Self-pay

## 2018-05-20 NOTE — Telephone Encounter (Signed)
Called pt and let her know [facility] results were back. She asked me to not discuss results with her over the phone because her husband wanted to know at the same time. She requested me to just scan it into her chart and her and her husband will read it together. Results scanned

## 2018-05-24 ENCOUNTER — Encounter: Payer: Self-pay | Admitting: Certified Nurse Midwife

## 2018-06-14 ENCOUNTER — Encounter (HOSPITAL_COMMUNITY): Payer: Self-pay

## 2018-06-21 ENCOUNTER — Ambulatory Visit (HOSPITAL_COMMUNITY)
Admission: RE | Admit: 2018-06-21 | Discharge: 2018-06-21 | Disposition: A | Discharge status: Home / Self Care | Payer: Medicaid Other | Source: Ambulatory Visit | Attending: Certified Nurse Midwife | Admitting: Certified Nurse Midwife

## 2018-06-21 ENCOUNTER — Other Ambulatory Visit (HOSPITAL_COMMUNITY): Payer: Self-pay | Admitting: *Deleted

## 2018-06-21 DIAGNOSIS — Z3A19 19 weeks gestation of pregnancy: Secondary | ICD-10-CM | POA: Diagnosis not present

## 2018-06-21 DIAGNOSIS — Z3480 Encounter for supervision of other normal pregnancy, unspecified trimester: Secondary | ICD-10-CM | POA: Diagnosis present

## 2018-06-21 DIAGNOSIS — Z362 Encounter for other antenatal screening follow-up: Secondary | ICD-10-CM

## 2018-06-21 DIAGNOSIS — Z363 Encounter for antenatal screening for malformations: Secondary | ICD-10-CM | POA: Diagnosis not present

## 2018-06-28 ENCOUNTER — Ambulatory Visit (INDEPENDENT_AMBULATORY_CARE_PROVIDER_SITE_OTHER): Payer: Medicaid Other

## 2018-06-28 VITALS — BP 119/71 | HR 99 | Wt 168.0 lb

## 2018-06-28 DIAGNOSIS — Z3480 Encounter for supervision of other normal pregnancy, unspecified trimester: Secondary | ICD-10-CM

## 2018-06-28 DIAGNOSIS — Z3A20 20 weeks gestation of pregnancy: Secondary | ICD-10-CM

## 2018-06-28 DIAGNOSIS — Z3482 Encounter for supervision of other normal pregnancy, second trimester: Secondary | ICD-10-CM

## 2018-06-28 NOTE — Progress Notes (Cosign Needed Addendum)
   PRENATAL VISIT NOTE  Subjective:  Amy Jacobson is a 33 y.o. [phone] at [redacted]w[redacted]d being seen today for ongoing prenatal care.  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  Patient reports no complaints.  Contractions: Not present. Vag. Bleeding: None.  Movement: Present. Denies leaking of fluid.   The following portions of the patient's history were reviewed and updated as appropriate: allergies, current medications, past family history, past medical history, past social history, past surgical history and problem list. Problem list updated.  Objective:   Vitals:   06/28/18 0813  BP: 119/71  Pulse: 99  Weight: 168 lb (76.2 kg)    Fetal Status: Fetal Heart Rate (bpm): 156 Fundal Height: 19 cm Movement: Present     General:  Alert, oriented and cooperative. Patient is in no acute distress.  Skin: Skin is warm and dry. No rash noted.   Cardiovascular: Normal heart rate noted  Respiratory: Normal respiratory effort, no problems with respiration noted  Abdomen: Soft, gravid, appropriate for gestational age.  Pain/Pressure: Present     Pelvic: Cervical exam deferred        Extremities: Normal range of motion.  Edema: None  Mental Status: Normal mood and affect. Normal behavior. Normal judgment and thought content.   Assessment and Plan:  Pregnancy: G4P3003 at [redacted]w[redacted]d  1. Supervision of other normal pregnancy, antepartum Routine prenatal care.  Anticipatory guidance given.  Plan for GTT next visit.  Patient declines glucola and wants to do jellybeans instead.  Instructed to bring jellybeans with her to next appointment and plan for a longer appointment time because of the GTT.  On Babyscripts schedule, f/u in 8 weeks.  Preterm labor symptoms and general obstetric precautions including but not  limited to vaginal bleeding, contractions, leaking of fluid and fetal movement were reviewed in detail with the patient. Please refer to After Visit Summary for other counseling recommendations.  Return in about 8 weeks (around 08/23/2018).  Future Appointments  Date Time Provider Department Center  07/26/2018  8:15 AM [facility] [place] 4 [facility]    Louis Matte. Earlene Plater, SNP   I confirm that I have verified the information documented in the nurse practitioner student's note and that I have also personally reperformed the history, physical exam and all medical decision making activities of this service and have verified that all service and findings are accurately documented in this student's note.   Rolm Bookbinder, [place] 06/28/2018 9:06 AM

## 2018-06-28 NOTE — Patient Instructions (Signed)

## 2018-07-15 ENCOUNTER — Telehealth: Payer: Self-pay | Admitting: *Deleted

## 2018-07-15 NOTE — Telephone Encounter (Signed)
Left patient a message to call and schedule ROB appointment.

## 2018-07-26 ENCOUNTER — Other Ambulatory Visit: Payer: Self-pay

## 2018-07-26 ENCOUNTER — Ambulatory Visit (HOSPITAL_COMMUNITY)
Admission: RE | Admit: 2018-07-26 | Discharge: 2018-07-26 | Disposition: A | Discharge status: Home / Self Care | Payer: Medicaid Other | Source: Ambulatory Visit | Attending: Certified Nurse Midwife | Admitting: Certified Nurse Midwife

## 2018-07-26 DIAGNOSIS — Z3A24 24 weeks gestation of pregnancy: Secondary | ICD-10-CM | POA: Diagnosis not present

## 2018-07-26 DIAGNOSIS — Z362 Encounter for other antenatal screening follow-up: Secondary | ICD-10-CM | POA: Diagnosis present

## 2018-08-27 ENCOUNTER — Ambulatory Visit (INDEPENDENT_AMBULATORY_CARE_PROVIDER_SITE_OTHER): Payer: Medicaid Other | Admitting: Certified Nurse Midwife

## 2018-08-27 ENCOUNTER — Other Ambulatory Visit: Payer: Self-pay

## 2018-08-27 ENCOUNTER — Encounter: Payer: Self-pay | Admitting: Certified Nurse Midwife

## 2018-08-27 VITALS — BP 119/81 | HR 85 | Wt 181.0 lb

## 2018-08-27 DIAGNOSIS — Z3483 Encounter for supervision of other normal pregnancy, third trimester: Secondary | ICD-10-CM | POA: Diagnosis not present

## 2018-08-27 DIAGNOSIS — Z3480 Encounter for supervision of other normal pregnancy, unspecified trimester: Secondary | ICD-10-CM

## 2018-08-27 DIAGNOSIS — Z3A28 28 weeks gestation of pregnancy: Secondary | ICD-10-CM | POA: Diagnosis not present

## 2018-08-27 NOTE — Patient Instructions (Signed)
- Encouraged patient to call ahead if experiencing respiratory symptoms with fever so that she can be triaged and directed appropriately if testing is warranted  Coronavirus (COVID-19) and Pregnancy:  Frequently Asked Questions   How might coronavirus affect my pregnancy? The data for COVID-19 is limited, but we know that women with other coronavirus infections (such as SARS-CoV) did NOT have miscarriage or stillbirth at higher rates than the general population.  On the other hand, we know that having other respiratory viral infections during pregnancy, such as flu, has been associated with problems like low birth weight and preterm birth. Also, having a high fever early in pregnancy may increase the risk of certain birth defects.  Could I transmit coronavirus to my baby during pregnancy or delivery? Among the few case studies of infants born to mothers with COVID-19 published in peer-reviewed literature, none of the infants tested positive for the virus. And there have been no reports of mother-to-baby transmission for other coronaviruses (MERS-CoV and SARS-CoV). Also, there was no virus detected in samples of amniotic fluid or breast milk. But there have been a few reports of newborns as [place] as a few days old with infection, suggesting that a mother can transmit the infection to her infant through close contact after delivery.  Is it safe for me to deliver at a hospital where there have been COVID-19 cases? It should be. We know that COVID-19 is a very scary virus. The good news is that hospitals are taking great precautions to keep patients and healthcare providers safe.  According to the CDC guidelines, when a patient is even suspected to have COVID-19, they should be placed in a negative pressure room. (Think of these rooms as vacuums that suck and filter the air so it's safe for the other people in the hospital.) If there are no rooms available, these patients should be asked to wait at home  until they can be accomodated safely. This should make it possible for you to deliver at the hospital without putting you or your baby at risk.  Hospitals are also implementing stricter visiting policies to keep patients safe. It's worth calling your hospital to check if there are any new regulations to be aware of.  What plans should I make now in case the hospital system is overwhelmed when it's time for me to deliver? Every hospital is making different plans for dealing with this scenario. Talk with your doctor or midwife once you're at least [redacted] weeks pregnant. I work in [employer], [employer].   I work in [employer]. Should I ask my doctor to excuse me from work until the baby is born? Should I ask my doctor to excuse me from work until the baby is born? What if I work in a school, the travel industry, or some other high-risk setting? Healthcare facilities should take care to limit the exposure of pregnant employees to patients with confirmed or suspected COVID-19, just as they would with other infectious cases. If you continue working, be sure to follow the CDC's risk assessment and infection control guidelines.  If you work in a school, travel industry, or other high-risk setting, talk with your employer about what it's doing to protect employees and minimize infection risks. Wash your hands often.   What if my OB gets COVID-19? If your doctor or midwife tests positive for COVID-19, they will need to be quarantined until they recover and are no longer at risk of transmitting the virus. In this case, you'll be assigned to  another OB in your doctor's practice (or you may choose another practitioner yourself). Ask your new OB or your doctor's office if you should self-quarantine or be tested for the virus. (It will depend on when you last saw your provider and when that person tested positive.)  Should we hold off on trying to conceive because of COVID-19? At this time, there's no reason to hold off on  trying to get pregnant, but the data we have is really limited. For example, we don't think the virus causes birth defects or increases your risk of miscarriage. But we don't know for sure whether you could transmit COVID-19 to your baby before or during delivery. We also don't know if the virus lives in semen or can be sexually transmitted.  We have a babymoon scheduled in the next few months - should we cancel? Yes. At this time, the virus has reached more than 140 countries, and there are travel bans to [place], most of [place], and [place]. Places where large numbers of people gather are at highest risk, especially airports and cruise ships.  If you were planning travel in the [place] note that any travel setting increases your risk of exposure, and there are already many places where everyone is being asked to stay home. To see how the virus is spreading, check The [place] Times map based on CDC data.  For the most current advice to help you avoid exposure, check the CDC's COVID-19 travel page.  Will the hospital separate me from my newborn and keep the baby in quarantine? If you don't have COVID-19 and have not been exposed to the virus, the hospital will not separate you from your newborn. If you do test positive for COVID-19 or have been exposed but have no symptoms, the CDC, [employer] of Obstetricians and Gynecologists, and the Society for Maternal-Fetal Medicine all recommend that you be separated from your baby to decrease the risk of transmission to the baby. This would last until you are no longer at risk of transmitting the virus.  This scenario would, of course, be beyond heartbreaking. Talk to the hospital, your baby's pediatrician, and your family about how to plan for care of your baby in the event that you have to be separated after delivery. And try to make sure you have the emotional support you would need to endure the sadness and stress of having to potentially wait weeks to meet  your newborn.   My hospital is restricting visitors and only allowing one support person. If my support person leaves after the delivery, will they be allowed to come back? Every hospital has different policies. Contact your hospital or labor and delivery unit a week or so before delivery to get the most up-to-date restrictions. In general, if your support person needs to leave, they would be allowed back unless they knew they were exposed to COVID-19 after leaving your company.  My mom was planning to fly here to help me care for my new baby after delivery. Should I tell her not to come? Yes. If your mom is over 60 or has any serious chronic medical conditions (such as heart disease, lung disease, or diabetes), she is at higher risk of serious illness from COVID-19 and should avoid air travel. And remember that any travel setting increases a person's risk of exposure. So, it may be risky to have her around the baby after she has been traveling. For the most current advice on traveling, check the CDC's COVID-19  travel page.

## 2018-08-27 NOTE — Progress Notes (Signed)
Declines tdap 

## 2018-08-28 LAB — CBC
HCT: 33.8 % — ABNORMAL LOW (ref 35.0–45.0)
Hemoglobin: 11.6 g/dL — ABNORMAL LOW (ref 11.7–15.5)
MCH: 31.5 pg (ref 27.0–33.0)
MCHC: 34.3 g/dL (ref 32.0–36.0)
MCV: 91.8 fL (ref 80.0–100.0)
MPV: 11 fL (ref 7.5–12.5)
Platelets: 210 10*3/uL (ref 140–400)
RBC: 3.68 10*6/uL — ABNORMAL LOW (ref 3.80–5.10)
RDW: 12.3 % (ref 11.0–15.0)
WBC: 7 10*3/uL (ref 3.8–10.8)

## 2018-08-28 LAB — RPR: RPR Ser Ql: NONREACTIVE

## 2018-08-28 LAB — 2HR GTT W 1 HR, CARPENTER, 75 G
Glucose, 1 Hr, Gest: 114 mg/dL (ref 65–179)
Glucose, 2 Hr, Gest: 81 mg/dL (ref 65–152)
Glucose, Fasting, Gest: 85 mg/dL (ref 65–91)

## 2018-08-28 LAB — HIV ANTIBODY (ROUTINE TESTING W REFLEX): HIV 1&2 Ab, 4th Generation: NONREACTIVE

## 2018-08-28 NOTE — Progress Notes (Signed)
   PRENATAL VISIT NOTE  Subjective:  Amy Jacobson is a 33 y.o. [phone] at [redacted]w[redacted]d being seen today for ongoing prenatal care.  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  Patient reports no complaints.  Contractions: Not present. Vag. Bleeding: None.  Movement: Present. Denies leaking of fluid.   The following portions of the patient's history were reviewed and updated as appropriate: allergies, current medications, past family history, past medical history, past social history, past surgical history and problem list.   Objective:   Vitals:   08/27/18 0843  BP: 119/81  Pulse: 85  Weight: 181 lb (82.1 kg)    Fetal Status: Fetal Heart Rate (bpm): 150 Fundal Height: 28 cm Movement: Present     General:  Alert, oriented and cooperative. Patient is in no acute distress.  Skin: Skin is warm and dry. No rash noted.   Cardiovascular: Normal heart rate noted  Respiratory: Normal respiratory effort, no problems with respiration noted  Abdomen: Soft, gravid, appropriate for gestational age.  Pain/Pressure: Absent     Pelvic: Cervical exam deferred        Extremities: Normal range of motion.  Edema: None  Mental Status: Normal mood and affect. Normal behavior. Normal judgment and thought content.   Assessment and Plan:  Pregnancy: G4P3003 at [redacted]w[redacted]d 1. Supervision of other normal pregnancy, antepartum - Patient doing well no complaints - Anticipatory guidance on upcoming appointments  - COVID19 precautions and discussed telehealth visit with next appointment at 32 weeks  - Patient currently enrolled in babyscripts, encouraged to enter weekly BP and weight into app - 2Hr GTT w/ 1 Hr Carpenter 75 g - HIV antibody (with reflex) - CBC - RPR - Tdap vaccine greater than or equal to 7yo IM  Preterm  labor symptoms and general obstetric precautions including but not limited to vaginal bleeding, contractions, leaking of fluid and fetal movement were reviewed in detail with the patient. Please refer to After Visit Summary for other counseling recommendations.   Return in about 4 weeks (around 09/24/2018) for TELEHEALTH ROB/babyscripts .  Future Appointments  Date Time Provider Department Center  09/24/2018  9:00 AM Sharyon Cable, CNM [facility]    Sharyon Cable, CNM

## 2018-08-29 ENCOUNTER — Encounter

## 2018-09-24 ENCOUNTER — Encounter: Payer: Self-pay | Admitting: Certified Nurse Midwife

## 2018-09-25 ENCOUNTER — Ambulatory Visit (INDEPENDENT_AMBULATORY_CARE_PROVIDER_SITE_OTHER): Payer: Medicaid Other | Admitting: Obstetrics and Gynecology

## 2018-09-25 ENCOUNTER — Other Ambulatory Visit: Payer: Self-pay

## 2018-09-25 DIAGNOSIS — Z3A32 32 weeks gestation of pregnancy: Secondary | ICD-10-CM | POA: Diagnosis not present

## 2018-09-25 DIAGNOSIS — Z3480 Encounter for supervision of other normal pregnancy, unspecified trimester: Secondary | ICD-10-CM

## 2018-09-25 DIAGNOSIS — Z3483 Encounter for supervision of other normal pregnancy, third trimester: Secondary | ICD-10-CM

## 2018-09-25 NOTE — Progress Notes (Signed)
   TELEHEALTH VIRTUAL OBSTETRICS VISIT ENCOUNTER NOTE  I connected with LOKI ESCARCEGA on 09/25/18 at  9:30 AM EDT by Webex at home and verified that I am speaking with the correct person using two identifiers.   I discussed the limitations, risks, security and privacy concerns of performing an evaluation and management service by telephone and the availability of in person appointments. I also discussed with the patient that there may be a patient responsible charge related to this service. The patient expressed understanding and agreed to proceed.  Subjective:  Amy Jacobson is a 33 y.o. [phone] at [redacted]w[redacted]d being followed for ongoing prenatal care.  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  Patient reports no complaints. Reports fetal movement. Denies any contractions, bleeding or leaking of fluid.   The following portions of the patient's history were reviewed and updated as appropriate: allergies, current medications, past family history, past medical history, past social history, past surgical history and problem list.   Objective:   General:  Alert, oriented and cooperative.   Mental Status: Normal mood and affect perceived. Normal judgment and thought content.  Rest of physical exam deferred due to type of encounter  Assessment and Plan:  Pregnancy: G4P3003 at [redacted]w[redacted]d  1. Supervision of other normal pregnancy, antepartum  BP: 120/78 Pulse 102 Doing well, no complaints  Glucose testing normal    There are no diagnoses linked to this encounter. Preterm labor symptoms and general obstetric precautions including but not limited to vaginal bleeding, contractions, leaking of fluid and fetal movement were reviewed in detail with the patient.  I discussed the assessment and treatment plan  with the patient. The patient was provided an opportunity to ask questions and all were answered. The patient agreed with the plan and demonstrated an understanding of the instructions. The patient was advised to call back or seek an in-person office evaluation/go to MAU at [facility] for any urgent or concerning symptoms. Please refer to After Visit Summary for other counseling recommendations.   I provided 11 minutes of non-face-to-face time during this encounter.  Return for 2 weeks for virtual visit. .  Future Appointments  Date Time Provider Department Center  10/09/2018  8:45 AM Elye Harmsen, Harolyn Rutherford, NP [facility]  10/22/2018  9:00 AM Sharyon Cable, CNM [facility]    Venia Carbon, NP Center for [facility], [facility]

## 2018-10-09 ENCOUNTER — Other Ambulatory Visit: Payer: Self-pay

## 2018-10-09 ENCOUNTER — Ambulatory Visit (INDEPENDENT_AMBULATORY_CARE_PROVIDER_SITE_OTHER): Payer: Medicaid Other | Admitting: Obstetrics and Gynecology

## 2018-10-09 VITALS — BP 114/79 | HR 98 | Wt 189.0 lb

## 2018-10-09 DIAGNOSIS — Z3A34 34 weeks gestation of pregnancy: Secondary | ICD-10-CM

## 2018-10-09 DIAGNOSIS — Z3483 Encounter for supervision of other normal pregnancy, third trimester: Secondary | ICD-10-CM | POA: Diagnosis not present

## 2018-10-09 DIAGNOSIS — Z3480 Encounter for supervision of other normal pregnancy, unspecified trimester: Secondary | ICD-10-CM

## 2018-10-09 NOTE — Progress Notes (Signed)
   TELEHEALTH VIRTUAL OBSTETRICS VISIT ENCOUNTER NOTE  I connected with Amy Jacobson on 10/09/18 at  8:45 AM EDT by telephone at home and verified that I am speaking with the correct person using two identifiers.   I discussed the limitations, risks, security and privacy concerns of performing an evaluation and management service by telephone and the availability of in person appointments. I also discussed with the patient that there may be a patient responsible charge related to this service. The patient expressed understanding and agreed to proceed.  Subjective:  Amy Jacobson is a 33 y.o. [phone] at [redacted]w[redacted]d being followed for ongoing prenatal care.  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  Patient reports no complaints. Reports fetal movement. Denies any contractions, bleeding or leaking of fluid.   The following portions of the patient's history were reviewed and updated as appropriate: allergies, current medications, past family history, past medical history, past social history, past surgical history and problem list.   Objective:   General:  Alert, oriented and cooperative.   Mental Status: Normal mood and affect perceived. Normal judgment and thought content.  Rest of physical exam deferred due to type of encounter  Assessment and Plan:  Pregnancy: G4P3003 at [redacted]w[redacted]d  1. Supervision of other normal pregnancy, antepartum  BP: 114/79 Weight 189 Doing well, in office visit in 2 weeks for cultures. Still desires water birth, however understands changes due to Covid.   There are no diagnoses linked to this encounter. Preterm labor symptoms and general obstetric precautions including but not limited to vaginal bleeding, contractions, leaking of fluid and fetal movement were reviewed in  detail with the patient.  I discussed the assessment and treatment plan with the patient. The patient was provided an opportunity to ask questions and all were answered. The patient agreed with the plan and demonstrated an understanding of the instructions. The patient was advised to call back or seek an in-person office evaluation/go to MAU at [facility] for any urgent or concerning symptoms. Please refer to After Visit Summary for other counseling recommendations.   I provided 11 minutes of non-face-to-face time during this encounter.  Return in about 2 weeks (around 10/23/2018) for In office for [facility] .  Future Appointments  Date Time Provider Department Center  10/22/2018  9:00 AM Sharyon Cable, CNM [facility]    Venia Carbon, NP Center for [employer], [facility]

## 2018-10-22 ENCOUNTER — Other Ambulatory Visit: Payer: Self-pay

## 2018-10-22 ENCOUNTER — Ambulatory Visit (INDEPENDENT_AMBULATORY_CARE_PROVIDER_SITE_OTHER): Payer: Medicaid Other | Admitting: Certified Nurse Midwife

## 2018-10-22 ENCOUNTER — Encounter: Payer: Self-pay | Admitting: Certified Nurse Midwife

## 2018-10-22 ENCOUNTER — Other Ambulatory Visit (HOSPITAL_COMMUNITY)
Admission: RE | Admit: 2018-10-22 | Discharge: 2018-10-22 | Disposition: A | Discharge status: Home / Self Care | Payer: Medicaid Other | Source: Ambulatory Visit | Attending: Certified Nurse Midwife | Admitting: Certified Nurse Midwife

## 2018-10-22 VITALS — BP 112/71 | HR 108 | Wt 192.0 lb

## 2018-10-22 DIAGNOSIS — Z3A36 36 weeks gestation of pregnancy: Secondary | ICD-10-CM

## 2018-10-22 DIAGNOSIS — Z3480 Encounter for supervision of other normal pregnancy, unspecified trimester: Secondary | ICD-10-CM

## 2018-10-22 DIAGNOSIS — Z3483 Encounter for supervision of other normal pregnancy, third trimester: Secondary | ICD-10-CM

## 2018-10-22 NOTE — Patient Instructions (Signed)
Reasons to go to MAU:  1.  Contractions are  5 minutes apart or less, each last 1 minute, these have been going on for 1-2 hours, and you cannot walk or talk during them 2.  You have a large gush of fluid, or a trickle of fluid that will not stop and you have to wear a pad 3.  You have bleeding that is bright red, heavier than spotting--like menstrual bleeding (spotting can be normal in early labor or after a check of your cervix) 4.  You do not feel the baby moving like he/she normally does  

## 2018-10-22 NOTE — Progress Notes (Signed)
   PRENATAL VISIT NOTE  Subjective:  Amy Jacobson is a 33 y.o. [phone] at [redacted]w[redacted]d being seen today for ongoing prenatal care.  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  Patient reports no complaints.  Contractions: Not present. Vag. Bleeding: None.  Movement: Present. Denies leaking of fluid.   The following portions of the patient's history were reviewed and updated as appropriate: allergies, current medications, past family history, past medical history, past social history, past surgical history and problem list.   Objective:   Vitals:   10/22/18 0850  BP: 112/71  Pulse: (!) 108  Weight: 192 lb (87.1 kg)    Fetal Status: Fetal Heart Rate (bpm): 148 Fundal Height: 34 cm Movement: Present  Presentation: Vertex  General:  Alert, oriented and cooperative. Patient is in no acute distress.  Skin: Skin is warm and dry. No rash noted.   Cardiovascular: Normal heart rate noted  Respiratory: Normal respiratory effort, no problems with respiration noted  Abdomen: Soft, gravid, appropriate for gestational age.  Pain/Pressure: Present     Pelvic: Cervical exam performed Dilation: 1 Effacement (%): Thick Station: Ballotable  Extremities: Normal range of motion.  Edema: None  Mental Status: Normal mood and affect. Normal behavior. Normal judgment and thought content.   Assessment and Plan:  Pregnancy: G4P3003 at [redacted]w[redacted]d 1. Supervision of other normal pregnancy, antepartum - Patient doing well, no complaints - Routine prenatal care  - Anticipatory guidance on upcoming appointments  - Schedule mychart visit in 2 weeks for prenatal care - COVID19 precautions  - Culture, beta strep (group b only) - Cervicovaginal ancillary only( [place])  Preterm labor symptoms and general obstetric  precautions including but not limited to vaginal bleeding, contractions, leaking of fluid and fetal movement were reviewed in detail with the patient. Please refer to After Visit Summary for other counseling recommendations.   Return in about 2 weeks (around 11/05/2018) for ROB-mychart video visit .  Future Appointments  Date Time Provider [place]  11/05/2018  9:30 AM Lajean Manes, CNM [facility]  11/12/2018  8:55 AM Leftwich-Kirby, Kathie Dike, CNM [facility]    Lajean Manes, CNM

## 2018-10-24 LAB — CERVICOVAGINAL ANCILLARY ONLY
Chlamydia: NEGATIVE
Neisseria Gonorrhea: NEGATIVE

## 2018-10-25 LAB — CULTURE, BETA STREP (GROUP B ONLY)
MICRO NUMBER:: 551505
SPECIMEN QUALITY:: ADEQUATE

## 2018-11-05 ENCOUNTER — Other Ambulatory Visit: Payer: Self-pay

## 2018-11-05 ENCOUNTER — Encounter: Payer: Self-pay | Admitting: Certified Nurse Midwife

## 2018-11-05 ENCOUNTER — Telehealth (INDEPENDENT_AMBULATORY_CARE_PROVIDER_SITE_OTHER): Payer: Medicaid Other | Admitting: Certified Nurse Midwife

## 2018-11-05 VITALS — BP 117/81 | HR 100 | Wt 193.0 lb

## 2018-11-05 DIAGNOSIS — Z3483 Encounter for supervision of other normal pregnancy, third trimester: Secondary | ICD-10-CM

## 2018-11-05 DIAGNOSIS — Z3A38 38 weeks gestation of pregnancy: Secondary | ICD-10-CM

## 2018-11-05 DIAGNOSIS — Z3480 Encounter for supervision of other normal pregnancy, unspecified trimester: Secondary | ICD-10-CM

## 2018-11-05 NOTE — Progress Notes (Signed)
   [place] VIRTUAL VIDEO VISIT ENCOUNTER NOTE  Provider location: Center for [place] [facility] [place]   I connected with [name] on 11/05/18 at  9:30 AM EDT by WebEx Video Encounter at home and verified that I am speaking with the correct person using two identifiers.   I discussed the limitations, risks, security and privacy concerns of performing an evaluation and management service by telephone and the availability of in person appointments. I also discussed with the patient that there may be a patient responsible charge related to this service. The patient expressed understanding and agreed to proceed. Subjective:  Amy Jacobson is a 33 y.o. [phone] at [redacted]w[redacted]d being seen today for ongoing prenatal care.  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  Patient reports occasional contractions.  Contractions: Irritability. Vag. Bleeding: None.  Movement: Present. Denies any leaking of fluid.   The following portions of the patient's history were reviewed and updated as appropriate: allergies, current medications, past family history, past medical history, past social history, past surgical history and problem list.   Objective:   Vitals:   11/05/18 0932  BP: 117/81  Pulse: 100  Weight: 193 lb (87.5 kg)    Fetal Status:     Movement: Present     General:  Alert, oriented and cooperative. Patient is in no acute distress.  Respiratory: Normal respiratory effort, no problems with respiration noted  Mental Status: Normal mood and affect. Normal behavior. Normal judgment and thought content.  Rest of physical exam deferred due to type of encounter  Imaging: No results found.  Assessment and Plan:  Pregnancy: G4P3003 at [redacted]w[redacted]d 1. Supervision of other normal  pregnancy, antepartum -Patient doing well, no complaints  - Anticipatory guidance on upcoming appointments - Labor precautions and reasons to present to MAU especially with hx of rapid labor  - In person visit scheduled for next week for possible membrane sweeping if patient wants  - Routine prenatal care  - BP today 117/81  Term labor symptoms and general obstetric precautions including but not limited to vaginal bleeding, contractions, leaking of fluid and fetal movement were reviewed in detail with the patient. I discussed the assessment and treatment plan with the patient. The patient was provided an opportunity to ask questions and all were answered. The patient agreed with the plan and demonstrated an understanding of the instructions. The patient was advised to call back or seek an in-person office evaluation/go to MAU at [facility] for any urgent or concerning symptoms. Please refer to After Visit Summary for other counseling recommendations.   I provided 7 minutes of face-to-face time during this encounter.  Return in about 1 week (around 11/12/2018) for ROB-in person 39 weeks.  Future Appointments  Date Time Provider [place]  11/12/2018  8:55 AM Leftwich-Kirby, Kathie Dike, CNM [facility]    Lajean Manes, [place] for [employer], [employer]

## 2018-11-10 ENCOUNTER — Other Ambulatory Visit: Payer: Self-pay

## 2018-11-10 ENCOUNTER — Encounter (HOSPITAL_COMMUNITY): Payer: Self-pay

## 2018-11-10 ENCOUNTER — Inpatient Hospital Stay (HOSPITAL_COMMUNITY)
Admission: AD | Admit: 2018-11-10 | Discharge: 2018-11-10 | Disposition: A | Discharge status: Home / Self Care | Payer: Medicaid Other | Attending: Obstetrics and Gynecology | Admitting: Obstetrics and Gynecology

## 2018-11-10 DIAGNOSIS — Z3480 Encounter for supervision of other normal pregnancy, unspecified trimester: Secondary | ICD-10-CM

## 2018-11-10 DIAGNOSIS — Z3A39 39 weeks gestation of pregnancy: Secondary | ICD-10-CM | POA: Insufficient documentation

## 2018-11-10 DIAGNOSIS — O471 False labor at or after 37 completed weeks of gestation: Secondary | ICD-10-CM | POA: Diagnosis present

## 2018-11-10 DIAGNOSIS — Z8759 Personal history of other complications of pregnancy, childbirth and the puerperium: Secondary | ICD-10-CM

## 2018-11-10 LAB — URINALYSIS, ROUTINE W REFLEX MICROSCOPIC
Bilirubin Urine: NEGATIVE
Glucose, UA: NEGATIVE mg/dL
Hgb urine dipstick: NEGATIVE
Ketones, ur: 20 mg/dL — AB
Leukocytes,Ua: NEGATIVE
Nitrite: NEGATIVE
Protein, ur: NEGATIVE mg/dL
Specific Gravity, Urine: 1.016 (ref 1.005–1.030)
pH: 6 (ref 5.0–8.0)

## 2018-11-10 NOTE — MAU Note (Signed)
Contracted on Thur and Fri.  Had some last night.  This morning she was feeling them in her low back.  "Last night she just didn't feel her self, was nauseated, felt cold when in bed, even though she was under the covers and snuggle up to her husband.  Still felt off this morning."  Also has a hx of rapid deliveries.

## 2018-11-10 NOTE — MAU Provider Note (Signed)
Ms. Amy Jacobson is a O7P0340 at [redacted]w[redacted]d seen in MAU for labor. RN labor check, not seen by provider. SVE by RN Dilation: 1.5 Effacement (%): 50 Cervical Position: Middle Station: -2 Presentation: Vertex Exam by:: Fredda Hammed RN   Cervical exam unchanged after 1 hour or observation  NST - FHR: 150 bpm / moderate variability / accels present / decels absent / TOCO: irregular every 5-7 mins   Plan: D/C home with labor precautions and [place] [facility] scheduled appt with [facility] on 11/12/2018  Laury Deep, CNM  11/10/2018 11:29 AM

## 2018-11-10 NOTE — MAU Note (Signed)
I have communicated with Laury Deep CNM and reviewed vital signs:  Vitals:   11/10/18 1003 11/10/18 1124  BP: 132/84 121/75  Pulse: (!) 101 92  SpO2:  98%    Vaginal exam:  Dilation: 1.5 Effacement (%): 50 Cervical Position: Middle Station: -2 Presentation: Vertex Exam by:: Fredda Hammed RN ,   Also reviewed contraction pattern and that non-stress test is reactive.  It has been documented that patient is contracting irregularly with no cervical change over 1 hours not indicating active labor.  Patient denies any other complaints.  Based on this report provider has given order for discharge.  A discharge order and diagnosis entered by a provider.   Labor discharge instructions reviewed with patient.

## 2018-11-10 NOTE — Discharge Instructions (Signed)
Fetal Movement Counts Patient Name: ________________________________________________ Patient Due Date: ____________________ What is a fetal movement count?  A fetal movement count is the number of times that you feel your baby move during a certain amount of time. This may also be called a fetal kick count. A fetal movement count is recommended for every pregnant woman. You may be asked to start counting fetal movements as early as week 28 of your pregnancy. Pay attention to when your baby is most active. You may notice your baby's sleep and wake cycles. You may also notice things that make your baby move more. You should do a fetal movement count:  When your baby is normally most active.  At the same time each day. A good time to count movements is while you are resting, after having something to eat and drink. How do I count fetal movements? 1. Find a quiet, comfortable area. Sit, or lie down on your side. 2. Write down the date, the start time and stop time, and the number of movements that you felt between those two times. Take this information with you to your health care visits. 3. For 2 hours, count kicks, flutters, swishes, rolls, and jabs. You should feel at least 10 movements during 2 hours. 4. You may stop counting after you have felt 10 movements. 5. If you do not feel 10 movements in 2 hours, have something to eat and drink. Then, keep resting and counting for 1 hour. If you feel at least 4 movements during that hour, you may stop counting. Contact a health care provider if:  You feel fewer than 4 movements in 2 hours.  Your baby is not moving like he or she usually does. Date: ____________ Start time: ____________ Stop time: ____________ Movements: ____________ Date: ____________ Start time: ____________ Stop time: ____________ Movements: ____________ Date: ____________ Start time: ____________ Stop time: ____________ Movements: ____________ Date: ____________ Start time:  ____________ Stop time: ____________ Movements: ____________ Date: ____________ Start time: ____________ Stop time: ____________ Movements: ____________ Date: ____________ Start time: ____________ Stop time: ____________ Movements: ____________ Date: ____________ Start time: ____________ Stop time: ____________ Movements: ____________ Date: ____________ Start time: ____________ Stop time: ____________ Movements: ____________ Date: ____________ Start time: ____________ Stop time: ____________ Movements: ____________ This information is not intended to replace advice given to you by your health care provider. Make sure you discuss any questions you have with your health care provider. Document Released: 05/31/2006 Document Revised: 05/21/2018 Document Reviewed: 06/10/2015 Elsevier Patient Education  2020 Else[employer] Braxton Hicks Contractions Contractions of the uterus can occur throughout pregnancy, but they are not always a sign that you are in labor. You may have practice contractions called Braxton Hicks contractions. These false labor contractions are sometimes confused with true labor. What are Braxton Hicks contractions? Braxton Hicks contractions are tightening movements that occur in the muscles of the uterus before labor. Unlike true labor contractions, these contractions do not result in opening (dilation) and thinning of the cervix. Toward the end of pregnancy (32-34 weeks), Braxton Hicks contractions can happen more often and may become stronger. These contractions are sometimes difficult to tell apart from true labor because they can be very uncomfortable. You should not feel embarrassed if you go to the hospital with false labor. Sometimes, the only way to tell if you are in true labor is for your health care provider to look for changes in the cervix. The health care provider will do a physical exam and may monitor your contractions. If you   are not in true labor, the exam should show  that your cervix is not dilating and your water has not broken. If there are no other health problems associated with your pregnancy, it is completely safe for you to be sent home with false labor. You may continue to have Braxton Hicks contractions until you go into true labor. How to tell the difference between true labor and false labor True labor  Contractions last 30-70 seconds.  Contractions become very regular.  Discomfort is usually felt in the top of the uterus, and it spreads to the lower abdomen and low back.  Contractions do not go away with walking.  Contractions usually become more intense and increase in frequency.  The cervix dilates and gets thinner. False labor  Contractions are usually shorter and not as strong as true labor contractions.  Contractions are usually irregular.  Contractions are often felt in the front of the lower abdomen and in the groin.  Contractions may go away when you walk around or change positions while lying down.  Contractions get weaker and are shorter-lasting as time goes on.  The cervix usually does not dilate or become thin. Follow these instructions at home:   Take over-the-counter and prescription medicines only as told by your health care provider.  Keep up with your usual exercises and follow other instructions from your health care provider.  Eat and drink lightly if you think you are going into labor.  If Braxton Hicks contractions are making you uncomfortable: ? Change your position from lying down or resting to walking, or change from walking to resting. ? Sit and rest in a tub of warm water. ? Drink enough fluid to keep your urine pale yellow. Dehydration may cause these contractions. ? Do slow and deep breathing several times an hour.  Keep all follow-up prenatal visits as told by your health care provider. This is important. Contact a health care provider if:  You have a fever.  You have continuous pain in  your abdomen. Get help right away if:  Your contractions become stronger, more regular, and closer together.  You have fluid leaking or gushing from your vagina.  You pass blood-tinged mucus (bloody show).  You have bleeding from your vagina.  You have low back pain that you never had before.  You feel your baby's head pushing down and causing pelvic pressure.  Your baby is not moving inside you as much as it used to. Summary  Contractions that occur before labor are called Braxton Hicks contractions, false labor, or practice contractions.  Braxton Hicks contractions are usually shorter, weaker, farther apart, and less regular than true labor contractions. True labor contractions usually become progressively stronger and regular, and they become more frequent.  Manage discomfort from Braxton Hicks contractions by changing position, resting in a warm bath, drinking plenty of water, or practicing deep breathing. This information is not intended to replace advice given to you by your health care provider. Make sure you discuss any questions you have with your health care provider. Document Released: 09/14/2016 Document Revised: 04/13/2017 Document Reviewed: 09/14/2016 Elsevier Patient Education  2020 Elsevier Inc.  

## 2018-11-12 ENCOUNTER — Other Ambulatory Visit: Payer: Self-pay

## 2018-11-12 ENCOUNTER — Ambulatory Visit (INDEPENDENT_AMBULATORY_CARE_PROVIDER_SITE_OTHER): Payer: Medicaid Other | Admitting: Advanced Practice Midwife

## 2018-11-12 DIAGNOSIS — Z3480 Encounter for supervision of other normal pregnancy, unspecified trimester: Secondary | ICD-10-CM

## 2018-11-12 DIAGNOSIS — Z3A39 39 weeks gestation of pregnancy: Secondary | ICD-10-CM

## 2018-11-12 DIAGNOSIS — Z3483 Encounter for supervision of other normal pregnancy, third trimester: Secondary | ICD-10-CM

## 2018-11-12 NOTE — Patient Instructions (Signed)
Labor Precautions Reasons to come to MAU at [facility]:  1.  Contractions are  5 minutes apart or less, each last 1 minute, these have been going on for 1-2 hours, and you cannot walk or talk during them 2.  You have a large gush of fluid, or a trickle of fluid that will not stop and you have to wear a pad 3.  You have bleeding that is bright red, heavier than spotting--like menstrual bleeding (spotting can be normal in early labor or after a check of your cervix) 4.  You do not feel the baby moving like he/she normally does  

## 2018-11-12 NOTE — Progress Notes (Signed)
   PRENATAL VISIT NOTE  Subjective:  Amy Jacobson is a 33 y.o. [phone] at [redacted]w[redacted]d being seen today for ongoing prenatal care.  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  Patient reports fatigue and occasional contractions.  Contractions: Irritability. Vag. Bleeding: None.  Movement: Present. Denies leaking of fluid.   The following portions of the patient's history were reviewed and updated as appropriate: allergies, current medications, past family history, past medical history, past social history, past surgical history and problem list.   Objective:   Vitals:   11/12/18 0843  BP: 119/78  Weight: 192 lb (87.1 kg)    Fetal Status: Fetal Heart Rate (bpm): 146   Movement: Present     General:  Alert, oriented and cooperative. Patient is in no acute distress.  Skin: Skin is warm and dry. No rash noted.   Cardiovascular: Normal heart rate noted  Respiratory: Normal respiratory effort, no problems with respiration noted  Abdomen: Soft, gravid, appropriate for gestational age.  Pain/Pressure: Present     Pelvic: Cervical exam deferred        Extremities: Normal range of motion.  Edema: None  Mental Status: Normal mood and affect. Normal behavior. Normal judgment and thought content.   Assessment and Plan:  Pregnancy: G4P3003 at [redacted]w[redacted]d 1. Supervision of other normal pregnancy, antepartum --Anticipatory guidance about next visits/weeks of pregnancy given. --Reviewed safety, visitor policy, reassurance about COVID-19 for pregnancy at this time. Discussed possible changes to visits, including televisits, that may occur due to COVID-19.  The office remains open if pt needs to be seen and MAU is open 24 hours/day for OB emergencies.   --Plan for  OB visit/NST next week. Discussed IOL at 41 weeks as recommendation. Pt does not want to schedule. --Pt declines membrane sweep/exam today, is doing [employer] daily and having prelabor signs so feels like labor will start before next week's visit.  Reports good fetal movements.  Term labor symptoms and general obstetric precautions including but not limited to vaginal bleeding, contractions, leaking of fluid and fetal movement were reviewed in detail with the patient. Please refer to After Visit Summary for other counseling recommendations.   Return in about 1 week (around 11/19/2018).  Future Appointments  Date Time Provider [place]  11/19/2018  8:15 AM Lajean Manes, CNM [facility]    Fatima Blank, CNM

## 2018-11-13 ENCOUNTER — Inpatient Hospital Stay (HOSPITAL_COMMUNITY)
Admission: AD | Admit: 2018-11-13 | Discharge: 2018-11-14 | DRG: 833 | Disposition: A | Discharge status: Home / Self Care | Payer: Medicaid Other | Attending: Obstetrics & Gynecology | Admitting: Obstetrics & Gynecology

## 2018-11-13 ENCOUNTER — Encounter (HOSPITAL_COMMUNITY): Payer: Self-pay

## 2018-11-13 ENCOUNTER — Other Ambulatory Visit: Payer: Self-pay

## 2018-11-13 DIAGNOSIS — Z3A39 39 weeks gestation of pregnancy: Secondary | ICD-10-CM

## 2018-11-13 DIAGNOSIS — Z3480 Encounter for supervision of other normal pregnancy, unspecified trimester: Secondary | ICD-10-CM

## 2018-11-13 DIAGNOSIS — O99343 Other mental disorders complicating pregnancy, third trimester: Secondary | ICD-10-CM | POA: Diagnosis present

## 2018-11-13 DIAGNOSIS — Z8759 Personal history of other complications of pregnancy, childbirth and the puerperium: Secondary | ICD-10-CM

## 2018-11-13 DIAGNOSIS — O471 False labor at or after 37 completed weeks of gestation: Principal | ICD-10-CM | POA: Diagnosis present

## 2018-11-13 DIAGNOSIS — F419 Anxiety disorder, unspecified: Secondary | ICD-10-CM | POA: Diagnosis present

## 2018-11-13 DIAGNOSIS — Z1159 Encounter for screening for other viral diseases: Secondary | ICD-10-CM

## 2018-11-13 DIAGNOSIS — Z91040 Latex allergy status: Secondary | ICD-10-CM

## 2018-11-13 NOTE — MAU Note (Signed)
Pt c/o contractions that started today at 2030. Pt reports good fetal movement. Pt denies leaking of fluid and bleeding. Pt denies problems with this pregnancy.

## 2018-11-13 NOTE — MAU Note (Signed)
Pt to 217

## 2018-11-14 DIAGNOSIS — Z91040 Latex allergy status: Secondary | ICD-10-CM | POA: Diagnosis not present

## 2018-11-14 DIAGNOSIS — O99343 Other mental disorders complicating pregnancy, third trimester: Secondary | ICD-10-CM | POA: Diagnosis present

## 2018-11-14 DIAGNOSIS — F419 Anxiety disorder, unspecified: Secondary | ICD-10-CM | POA: Diagnosis present

## 2018-11-14 DIAGNOSIS — O471 False labor at or after 37 completed weeks of gestation: Secondary | ICD-10-CM | POA: Diagnosis present

## 2018-11-14 DIAGNOSIS — Z3A39 39 weeks gestation of pregnancy: Secondary | ICD-10-CM | POA: Diagnosis not present

## 2018-11-14 DIAGNOSIS — Z1159 Encounter for screening for other viral diseases: Secondary | ICD-10-CM | POA: Diagnosis not present

## 2018-11-14 LAB — CBC
HCT: 34.3 % — ABNORMAL LOW (ref 36.0–46.0)
Hemoglobin: 11.3 g/dL — ABNORMAL LOW (ref 12.0–15.0)
MCH: 30.2 pg (ref 26.0–34.0)
MCHC: 32.9 g/dL (ref 30.0–36.0)
MCV: 91.7 fL (ref 80.0–100.0)
Platelets: 197 10*3/uL (ref 150–400)
RBC: 3.74 MIL/uL — ABNORMAL LOW (ref 3.87–5.11)
RDW: 13.4 % (ref 11.5–15.5)
WBC: 6.7 10*3/uL (ref 4.0–10.5)
nRBC: 0 % (ref 0.0–0.2)

## 2018-11-14 LAB — ABO/RH: ABO/RH(D): A POS

## 2018-11-14 LAB — TYPE AND SCREEN
ABO/RH(D): A POS
Antibody Screen: NEGATIVE

## 2018-11-14 LAB — SARS CORONAVIRUS 2 BY RT PCR (HOSPITAL ORDER, PERFORMED IN ~~LOC~~ HOSPITAL LAB): SARS Coronavirus 2: NEGATIVE

## 2018-11-14 LAB — SYPHILIS: RPR W/REFLEX TO RPR TITER AND TREPONEMAL ANTIBODIES, TRADITIONAL SCREENING AND DIAGNOSIS ALGORITHM: RPR Ser Ql: NONREACTIVE

## 2018-11-14 MED ORDER — ONDANSETRON HCL 4 MG/2ML IJ SOLN: 4.0000 mg | Freq: Four times a day (QID) | INTRAMUSCULAR | Status: DC | PRN

## 2018-11-14 MED ORDER — OXYTOCIN BOLUS FROM INFUSION: 500.0000 mL | Freq: Once | INTRAVENOUS | Status: DC

## 2018-11-14 MED ORDER — OXYCODONE-ACETAMINOPHEN 5-325 MG PO TABS: 2.0000 | ORAL_TABLET | ORAL | Status: DC | PRN

## 2018-11-14 MED ORDER — OXYTOCIN 40 UNITS IN NORMAL SALINE INFUSION - SIMPLE MED: 2.5000 [IU]/h | INTRAVENOUS | Status: DC

## 2018-11-14 MED ORDER — LACTATED RINGERS IV SOLN: 500.0000 mL | INTRAVENOUS | Status: DC | PRN

## 2018-11-14 MED ORDER — OXYCODONE-ACETAMINOPHEN 5-325 MG PO TABS: 1.0000 | ORAL_TABLET | ORAL | Status: DC | PRN

## 2018-11-14 MED ORDER — LACTATED RINGERS IV SOLN: INTRAVENOUS | Status: DC

## 2018-11-14 MED ORDER — ACETAMINOPHEN 325 MG PO TABS: 650.0000 mg | ORAL_TABLET | ORAL | Status: DC | PRN

## 2018-11-14 NOTE — Discharge Instructions (Signed)
Braxton Hicks Contractions Contractions of the uterus can occur throughout pregnancy, but they are not always a sign that you are in labor. You may have practice contractions called Braxton Hicks contractions. These false labor contractions are sometimes confused with true labor. What are Braxton Hicks contractions? Braxton Hicks contractions are tightening movements that occur in the muscles of the uterus before labor. Unlike true labor contractions, these contractions do not result in opening (dilation) and thinning of the cervix. Toward the end of pregnancy (32-34 weeks), Braxton Hicks contractions can happen more often and may become stronger. These contractions are sometimes difficult to tell apart from true labor because they can be very uncomfortable. You should not feel embarrassed if you go to the hospital with false labor. Sometimes, the only way to tell if you are in true labor is for your health care provider to look for changes in the cervix. The health care provider will do a physical exam and may monitor your contractions. If you are not in true labor, the exam should show that your cervix is not dilating and your water has not broken. If there are no other health problems associated with your pregnancy, it is completely safe for you to be sent home with false labor. You may continue to have Braxton Hicks contractions until you go into true labor. How to tell the difference between true labor and false labor True labor  Contractions last 30-70 seconds.  Contractions become very regular.  Discomfort is usually felt in the top of the uterus, and it spreads to the lower abdomen and low back.  Contractions do not go away with walking.  Contractions usually become more intense and increase in frequency.  The cervix dilates and gets thinner. False labor  Contractions are usually shorter and not as strong as true labor contractions.  Contractions are usually irregular.  Contractions  are often felt in the front of the lower abdomen and in the groin.  Contractions may go away when you walk around or change positions while lying down.  Contractions get weaker and are shorter-lasting as time goes on.  The cervix usually does not dilate or become thin. Follow these instructions at home:   Take over-the-counter and prescription medicines only as told by your health care provider.  Keep up with your usual exercises and follow other instructions from your health care provider.  Eat and drink lightly if you think you are going into labor.  If Braxton Hicks contractions are making you uncomfortable: ? Change your position from lying down or resting to walking, or change from walking to resting. ? Sit and rest in a tub of warm water. ? Drink enough fluid to keep your urine pale yellow. Dehydration may cause these contractions. ? Do slow and deep breathing several times an hour.  Keep all follow-up prenatal visits as told by your health care provider. This is important. Contact a health care provider if:  You have a fever.  You have continuous pain in your abdomen. Get help right away if:  Your contractions become stronger, more regular, and closer together.  You have fluid leaking or gushing from your vagina.  You pass blood-tinged mucus (bloody show).  You have bleeding from your vagina.  You have low back pain that you never had before.  You feel your baby's head pushing down and causing pelvic pressure.  Your baby is not moving inside you as much as it used to. Summary  Contractions that occur before labor are   called Braxton Hicks contractions, false labor, or practice contractions.  Braxton Hicks contractions are usually shorter, weaker, farther apart, and less regular than true labor contractions. True labor contractions usually become progressively stronger and regular, and they become more frequent.  Manage discomfort from Braxton Hicks contractions  by changing position, resting in a warm bath, drinking plenty of water, or practicing deep breathing. This information is not intended to replace advice given to you by your health care provider. Make sure you discuss any questions you have with your health care provider. Document Released: 09/14/2016 Document Revised: 04/13/2017 Document Reviewed: 09/14/2016 Elsevier Patient Education  2020 Elsevier Inc.  

## 2018-11-14 NOTE — Discharge Summary (Signed)
Obstetrics Discharge Summary [facility]N [facility]   Patient Name: Amy Jacobson DOB: 19-Dec-1985 MRN: [id number]  Date of admission: 11/13/2018 Date of discharge: 11/14/2018  Admitting diagnosis: 40 WKS, CTX Intrauterine pregnancy: 7270w6d     Secondary diagnosis:   Principal Problem:   False labor after 37 completed weeks of gestation Active Problems:   Anxiety    Discharge diagnosis: False Labor                                             Hospital course: Amy Jacobson is a 33 y.o. 4570w6d who was admitted for early labor with history of precipitous delivery. Her pregnancy was complicated by above noted. She was monitored for about 2 hours with cervical change noted, so she was admitted given >39 weeks, history of precipitous delivery and her concern about going home. Her contractions then spaced out after a couple of hours and she was sleeping so not transitioning into active labor. On recheck, her cervix was 3-4/60/ballotable. Counseled that early/false labor and would not recommend AROM given ballotable station and risk of cord prolapse. Recommended discharge home with labor precautions and patient amenable to plan. Reviewed comfort measures for early labor and counseled on when to return to MAU. Encouraged to keep follow-up appointment next week if still not delivered. Reassuring and reactive fetal tracing throughout observation on L&D.   Physical exam  Vitals:   11/14/18 0442 11/14/18 0530 11/14/18 0600 11/14/18 0614  BP: (!) 105/56   113/72  Pulse: 98   93  Resp: 18 17 16 17   Temp:    98.4 F (36.9 C)  TempSrc:    Oral  Weight:      Height:       General appearance: comfortable, NAD Lungs: no respiratory distress Heart: regular rate  Abdomen: soft, non-tender; gravid  Pelvic: deferred Extremities: no significant LE edema Presentation: cephalic by sutures Fetal monitoring: 135/mod/+a/-d Uterine activity: no contractions, uterine irritability  SVE: Dilation: 3.5 Effacement  (%): 60 Station: Ballotable Exam by:: Dr. Earlene PlaterWallace  Labs: Lab Results  Component Value Date   WBC 6.7 11/14/2018   HGB 11.3 (L) 11/14/2018   HCT 34.3 (L) 11/14/2018   MCV 91.7 11/14/2018   PLT 197 11/14/2018   CMP Latest Ref Rng & Units 10/27/2014  Glucose 65 - 99 mg/dL 93  BUN 6 - 20 mg/dL 7  Creatinine 7.820.44 - 9.561.00 mg/dL 2.130.51  Sodium 086135 - 578145 mmol/L 133(L)  Potassium 3.5 - 5.1 mmol/L 3.6  Chloride 101 - 111 mmol/L 107  CO2 22 - 32 mmol/L 18(L)  Calcium 8.9 - 10.3 mg/dL 4.6(N8.5(L)  Total Protein 6.5 - 8.1 g/dL 6.5  Total Bilirubin 0.3 - 1.2 mg/dL 6.2(X0.2(L)  Alkaline Phos 38 - 126 U/L 151(H)  AST 15 - 41 U/L 17  ALT 14 - 54 U/L 11(L)    Discharge instructions: Per After Visit Summary and "Baby and Me Booklet"  After visit meds:  Allergies as of 11/14/2018      Reactions   Benadryl [diphenhydramine] Anaphylaxis   Latex Itching   Calamine Medicated [pramoxine-calamine] Rash   Night-time Cough [doxylamine-dm] Palpitations      Medication List    TAKE these medications   IRON (FERROUS SULFATE) PO iron   prenatal multivitamin Tabs tablet Take 1 tablet by mouth daily at 12 noon.   PROBIOTIC DAILY PO Take by  mouth.      Diet: Routine Diet Activity: Advance as tolerated. Pelvic rest for 6 weeks.   Follow-up Appt: Future Appointments  Date Time Provider [place]  11/19/2018  8:15 AM Lajean Manes, CNM [facility]
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
## 2018-11-19 ENCOUNTER — Ambulatory Visit (INDEPENDENT_AMBULATORY_CARE_PROVIDER_SITE_OTHER): Payer: Medicaid Other | Admitting: Certified Nurse Midwife

## 2018-11-19 ENCOUNTER — Other Ambulatory Visit: Payer: Self-pay

## 2018-11-19 ENCOUNTER — Inpatient Hospital Stay (HOSPITAL_COMMUNITY)
Admission: AD | Admit: 2018-11-19 | Discharge: 2018-11-21 | DRG: 807 | Disposition: A | Discharge status: Home / Self Care | Payer: Medicaid Other | Attending: Family Medicine | Admitting: Family Medicine

## 2018-11-19 ENCOUNTER — Encounter: Payer: Self-pay | Admitting: Certified Nurse Midwife

## 2018-11-19 ENCOUNTER — Encounter (HOSPITAL_COMMUNITY): Payer: Self-pay | Admitting: *Deleted

## 2018-11-19 VITALS — BP 117/76 | HR 98 | Wt 193.0 lb

## 2018-11-19 DIAGNOSIS — Z3480 Encounter for supervision of other normal pregnancy, unspecified trimester: Secondary | ICD-10-CM

## 2018-11-19 DIAGNOSIS — Z3A40 40 weeks gestation of pregnancy: Secondary | ICD-10-CM

## 2018-11-19 DIAGNOSIS — Z91040 Latex allergy status: Secondary | ICD-10-CM

## 2018-11-19 DIAGNOSIS — Z8759 Personal history of other complications of pregnancy, childbirth and the puerperium: Secondary | ICD-10-CM

## 2018-11-19 DIAGNOSIS — O471 False labor at or after 37 completed weeks of gestation: Secondary | ICD-10-CM

## 2018-11-19 DIAGNOSIS — O480 Post-term pregnancy: Secondary | ICD-10-CM | POA: Diagnosis not present

## 2018-11-19 DIAGNOSIS — O26893 Other specified pregnancy related conditions, third trimester: Secondary | ICD-10-CM | POA: Diagnosis present

## 2018-11-19 MED ORDER — ACETAMINOPHEN 325 MG PO TABS: 650.0000 mg | ORAL_TABLET | ORAL | Status: DC | PRN

## 2018-11-19 MED ORDER — LIDOCAINE HCL (PF) 1 % IJ SOLN: 30.0000 mL | INTRAMUSCULAR | Status: DC | PRN

## 2018-11-19 MED ORDER — OXYTOCIN 40 UNITS IN NORMAL SALINE INFUSION - SIMPLE MED: 2.5000 [IU]/h | INTRAVENOUS | Status: DC

## 2018-11-19 MED ORDER — OXYTOCIN 40 UNITS IN NORMAL SALINE INFUSION - SIMPLE MED
INTRAVENOUS | Status: AC
  Filled 2018-11-19: qty 1000

## 2018-11-19 MED ORDER — ONDANSETRON HCL 4 MG/2ML IJ SOLN: 4.0000 mg | Freq: Four times a day (QID) | INTRAMUSCULAR | Status: DC | PRN

## 2018-11-19 MED ORDER — LACTATED RINGERS IV SOLN: 500.0000 mL | INTRAVENOUS | Status: DC | PRN

## 2018-11-19 MED ORDER — OXYCODONE-ACETAMINOPHEN 5-325 MG PO TABS: 1.0000 | ORAL_TABLET | ORAL | Status: DC | PRN

## 2018-11-19 MED ORDER — OXYCODONE-ACETAMINOPHEN 5-325 MG PO TABS: 2.0000 | ORAL_TABLET | ORAL | Status: DC | PRN

## 2018-11-19 MED ORDER — OXYTOCIN BOLUS FROM INFUSION
500.0000 mL | Freq: Once | INTRAVENOUS | Status: AC
  Administered 2018-11-20: 500 mL via INTRAVENOUS

## 2018-11-19 MED ORDER — LACTATED RINGERS IV SOLN
INTRAVENOUS | Status: DC
  Administered 2018-11-19 (×2): via INTRAVENOUS
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
## 2018-11-19 NOTE — Progress Notes (Signed)
   PRENATAL VISIT NOTE  Subjective:  Amy Jacobson is a 33 y.o. [phone] at [redacted]w[redacted]d being seen today for ongoing prenatal care.  She is currently monitored for the following issues for this low-risk pregnancy and has DEPRESSION, MILD; Latex allergy; Allergy to drug--benadryl; Supervision of other normal pregnancy, antepartum; H/O rapid labor; False labor after 37 completed weeks of gestation; and Anxiety on their problem list.  Patient reports occasional contractions.  Contractions: Irritability. Vag. Bleeding: None.  Movement: Present. Denies leaking of fluid.   The following portions of the patient's history were reviewed and updated as appropriate: allergies, current medications, past family history, past medical history, past social history, past surgical history and problem list.   Objective:   Vitals:   11/19/18 0808  BP: 117/76  Pulse: 98  Weight: 193 lb (87.5 kg)    Fetal Status: Fetal Heart Rate (bpm): 142 Fundal Height: 36 cm Movement: Present  Presentation: Vertex  General:  Alert, oriented and cooperative. Patient is in no acute distress.  Skin: Skin is warm and dry. No rash noted.   Cardiovascular: Normal heart rate noted  Respiratory: Normal respiratory effort, no problems with respiration noted  Abdomen: Soft, gravid, appropriate for gestational age.  Pain/Pressure: Present     Pelvic: Cervical exam performed Dilation: 3 Effacement (%): 50 Station: -3  Extremities: Normal range of motion.  Edema: None  Mental Status: Normal mood and affect. Normal behavior. Normal judgment and thought content.   Assessment and Plan:  Pregnancy: G4P3003 at [redacted]w[redacted]d 1. Supervision of other normal pregnancy, antepartum - Patient doing well, reports occasional contractions- has been d/c from hospital multiple times over the past week for false labor  - Educated and discussed post dates pregnancy with patient and antenatal screening  - Patient does not want to schedule induction at this time,  wants to go into labor naturally  - Discussed membrane sweep in office today if patient wants better chance of natural labor, membrane sweep done in office  - Labor precautions discussed  - NST reactive in office   2. H/O rapid labor  Term labor symptoms and general obstetric precautions including but not limited to vaginal bleeding, contractions, leaking of fluid and fetal movement were reviewed in detail with the patient. Please refer to After Visit Summary for other counseling recommendations.   Return in about 3 days (around 11/22/2018) for ROB, NST.  Future Appointments  Date Time Provider [place]  11/21/2018  3:15 PM Emily Filbert, MD [facility]    [place], [place]

## 2018-11-19 NOTE — MAU Note (Signed)
PT SAYS  PNC WITH [place] [facility] - 3 CM .  DENIES HSV AND MRSA. GBS- NEG . UC STRONG SINCE 940PM.

## 2018-11-20 ENCOUNTER — Encounter (HOSPITAL_COMMUNITY): Payer: Self-pay

## 2018-11-20 DIAGNOSIS — O480 Post-term pregnancy: Secondary | ICD-10-CM

## 2018-11-20 DIAGNOSIS — Z3A40 40 weeks gestation of pregnancy: Secondary | ICD-10-CM

## 2018-11-20 LAB — TYPE AND SCREEN
ABO/RH(D): A POS
Antibody Screen: NEGATIVE

## 2018-11-20 LAB — CBC
HCT: 36.7 % (ref 36.0–46.0)
Hemoglobin: 11.7 g/dL — ABNORMAL LOW (ref 12.0–15.0)
MCH: 30.4 pg (ref 26.0–34.0)
MCHC: 31.9 g/dL (ref 30.0–36.0)
MCV: 95.3 fL (ref 80.0–100.0)
Platelets: 198 10*3/uL (ref 150–400)
RBC: 3.85 MIL/uL — ABNORMAL LOW (ref 3.87–5.11)
RDW: 13.7 % (ref 11.5–15.5)
WBC: 6.6 10*3/uL (ref 4.0–10.5)
nRBC: 0 % (ref 0.0–0.2)

## 2018-11-20 LAB — RPR: RPR Ser Ql: NONREACTIVE

## 2018-11-20 MED ORDER — IBUPROFEN 600 MG PO TABS
600.0000 mg | ORAL_TABLET | Freq: Four times a day (QID) | ORAL | Status: DC
  Administered 2018-11-20 – 2018-11-21 (×4): 600 mg via ORAL
  Filled 2018-11-20 (×6): qty 1

## 2018-11-20 MED ORDER — PRENATAL MULTIVITAMIN CH
1.0000 | ORAL_TABLET | Freq: Every day | ORAL | Status: DC
  Administered 2018-11-20 – 2018-11-21 (×2): 1 via ORAL
  Filled 2018-11-20 (×2): qty 1

## 2018-11-20 MED ORDER — SENNOSIDES-DOCUSATE SODIUM 8.6-50 MG PO TABS
2.0000 | ORAL_TABLET | ORAL | Status: DC
  Administered 2018-11-20: 2 via ORAL
  Filled 2018-11-20: qty 2

## 2018-11-20 MED ORDER — ONDANSETRON HCL 4 MG PO TABS: 4.0000 mg | ORAL_TABLET | ORAL | Status: DC | PRN

## 2018-11-20 MED ORDER — SODIUM CHLORIDE 0.9% FLUSH: 3.0000 mL | INTRAVENOUS | Status: DC | PRN

## 2018-11-20 MED ORDER — ZOLPIDEM TARTRATE 5 MG PO TABS: 5.0000 mg | ORAL_TABLET | Freq: Every evening | ORAL | Status: DC | PRN

## 2018-11-20 MED ORDER — BENZOCAINE-MENTHOL 20-0.5 % EX AERO: 1.0000 "application " | INHALATION_SPRAY | CUTANEOUS | Status: DC | PRN

## 2018-11-20 MED ORDER — SIMETHICONE 80 MG PO CHEW: 80.0000 mg | CHEWABLE_TABLET | ORAL | Status: DC | PRN

## 2018-11-20 MED ORDER — COCONUT OIL OIL: 1.0000 "application " | TOPICAL_OIL | Status: DC | PRN

## 2018-11-20 MED ORDER — TETANUS-DIPHTH-ACELL PERTUSSIS 5-2.5-18.5 LF-MCG/0.5 IM SUSP: 0.5000 mL | Freq: Once | INTRAMUSCULAR | Status: DC

## 2018-11-20 MED ORDER — MEASLES, MUMPS & RUBELLA VAC IJ SOLR: 0.5000 mL | Freq: Once | INTRAMUSCULAR | Status: DC

## 2018-11-20 MED ORDER — SODIUM CHLORIDE 0.9 % IV SOLN: 250.0000 mL | INTRAVENOUS | Status: DC | PRN

## 2018-11-20 MED ORDER — ONDANSETRON HCL 4 MG/2ML IJ SOLN: 4.0000 mg | INTRAMUSCULAR | Status: DC | PRN

## 2018-11-20 MED ORDER — ACETAMINOPHEN 325 MG PO TABS: 650.0000 mg | ORAL_TABLET | ORAL | Status: DC | PRN

## 2018-11-20 MED ORDER — FENTANYL CITRATE (PF) 100 MCG/2ML IJ SOLN
100.0000 ug | INTRAMUSCULAR | Status: DC | PRN
  Administered 2018-11-20 (×2): 100 ug via INTRAVENOUS
  Filled 2018-11-20 (×2): qty 2

## 2018-11-20 MED ORDER — SODIUM CHLORIDE 0.9% FLUSH: 3.0000 mL | Freq: Two times a day (BID) | INTRAVENOUS | Status: DC

## 2018-11-20 NOTE — Discharge Summary (Signed)
Postpartum Discharge Summary     Patient Name: Amy Jacobson DOB: 1985-10-11 MRN: [id number]  Date of admission: 11/19/2018 Delivering Provider: Allayne StackBEARD, SAMANTHA N   Date of discharge: 11/21/2018  Admitting diagnosis: 40 WKS, CTX Intrauterine pregnancy: 4150w5d     Secondary diagnosis:  Active Problems:   Labor and delivery, indication for care  Additional problems: none     Discharge diagnosis: Term Pregnancy Delivered                                                                                                Post partum procedures:None  Augmentation: None  Complications: None  Hospital course:  Onset of Labor With Vaginal Delivery     33 y.o. yo [phone] at 7050w5d was admitted in Latent Labor on 11/19/2018. Patient had an uncomplicated labor course as follows:  She had been seen and sent home on 11/14/18 for false labor She returned tonight in latent labor but progressed quickly to SVD Membrane Rupture Time/Date: 1:08 AM ,11/20/2018   Intrapartum Procedures: Episiotomy: None [1]                                         Lacerations:  None [1]  Patient had a delivery of a Viable infant. 11/20/2018  Information for the patient's newborn:  Maple HudsonYoung, Girl Morrie Sheldonshley [[id number]][[id number]]  Delivery Method: Vaginal, Spontaneous(Filed from Delivery Summary)     Pateint had an uncomplicated postpartum course.  She is ambulating, tolerating a regular diet, passing flatus, and urinating well. Patient is discharged home in stable condition on 11/21/18.   Magnesium Sulfate recieved: No BMZ received: No  Physical exam  Vitals:   11/20/18 1300 11/20/18 1933 11/20/18 2100 11/21/18 0600  BP: 117/84 115/72 (!) 113/58 115/78  Pulse: 97 79 81 80  Resp: 16 20 18 18   Temp: 98.1 F (36.7 C) 98.4 F (36.9 C) 97.9 F (36.6 C) 98.3 F (36.8 C)  TempSrc: Oral Oral Oral Oral  SpO2: 99%     Weight:      Height:       General: alert, cooperative and no distress Lochia: appropriate Uterine Fundus:  firm Incision: N/A DVT Evaluation: No evidence of DVT seen on physical exam. Labs: Lab Results  Component Value Date   WBC 6.6 11/19/2018   HGB 11.7 (L) 11/19/2018   HCT 36.7 11/19/2018   MCV 95.3 11/19/2018   PLT 198 11/19/2018   CMP Latest Ref Rng & Units 10/27/2014  Glucose 65 - 99 mg/dL 93  BUN 6 - 20 mg/dL 7  Creatinine 1.300.44 - 8.651.00 mg/dL 7.840.51  Sodium 696135 - 295145 mmol/L 133(L)  Potassium 3.5 - 5.1 mmol/L 3.6  Chloride 101 - 111 mmol/L 107  CO2 22 - 32 mmol/L 18(L)  Calcium 8.9 - 10.3 mg/dL 2.8(U8.5(L)  Total Protein 6.5 - 8.1 g/dL 6.5  Total Bilirubin 0.3 - 1.2 mg/dL 1.3(K0.2(L)  Alkaline Phos 38 - 126 U/L 151(H)  AST 15 - 41 U/L 17  ALT 14 - 54 U/L  11(L)    Discharge instruction: per After Visit Summary and "Baby and Me Booklet".  After visit meds:  Allergies as of 11/21/2018      Reactions   Benadryl [diphenhydramine] Anaphylaxis   Latex Itching   Calamine Medicated [pramoxine-calamine] Rash   Night-time Cough [doxylamine-dm] Palpitations      Medication List    TAKE these medications   ibuprofen 600 MG tablet Commonly known as: ADVIL Take 1 tablet (600 mg total) by mouth every 6 (six) hours.   IRON (FERROUS SULFATE) PO iron   prenatal multivitamin Tabs tablet Take 1 tablet by mouth daily at 12 noon.   PROBIOTIC DAILY PO Take by mouth.       Diet: routine diet  Activity: Advance as tolerated. Pelvic rest for 6 weeks.   Outpatient follow up:4 weeks Follow up Appt: No future appointments. Follow up Visit: [place] [facility] [place] [facility] [place]. Schedule an appointment as soon as possible for a visit in 4 week(s).   Specialty: Obstetrics and Gynecology Contact information: [place], [place] [phone]           Please schedule this patient for Postpartum visit in: 4 weeks with the following provider: Any provider For C/S patients schedule nurse incision check in weeks 2  weeks: no Low risk pregnancy complicated by: none Delivery mode:  SVD Anticipated Birth Control:  vasectomy PP Procedures needed: none  Schedule Integrated BH visit: no   Newborn Data: Live born female  Birth Weight:   APGAR: 31, 9  Newborn Delivery   Birth date/time: 11/20/2018 01:58:00 Delivery type: Vaginal, Spontaneous      Baby Feeding: Breast Disposition:home with mother   11/21/2018 Hansel Feinstein, CNM

## 2018-11-20 NOTE — Progress Notes (Signed)
MOB was referred for history of depression/anxiety. * Referral screened out by Clinical Social Worker because none of the following criteria appear to apply: ~ History of anxiety/depression during this pregnancy, or of post-partum depression following prior delivery. ~ Diagnosis of anxiety and/or depression within last 3 years. Per MOB's chart review MOB diagnosed in 2010.  OR * MOB's symptoms currently being treated with medication and/or therapy.     Please contact the Clinical Social Worker if needs arise, by [facility] request, or if MOB scores greater than 9/yes to question 10 on Edinburgh Postpartum Depression Screen.       Virgie Dad Alvina Strother, MSW, LCSW-A [facility] [place] [facility] [place] [phone]

## 2018-11-20 NOTE — Progress Notes (Signed)
Patient ID: Amy Jacobson, female   DOB: [birth year], 33 y.o.   MRN: [id number] Just SROMed Feels some pressure but declines exam  Vitals:   11/19/18 2316 11/20/18 0000  BP: 123/83 127/84  Pulse: (!) 108 (!) 102  Resp:  18  Temp: 98.1 F (36.7 C) 98 F (36.7 C)  TempSrc:  Oral  Weight: 87.5 kg   Height: 5\' 2"  (1.575 m)    FHR reactive, Category I UCs not tracing  Dilation: 5 Effacement (%): 80 Cervical Position: Middle Station: -2 Presentation: Vertex Exam by:: Elray Mcgregor, RN [phone]  WIll anticipate SVD

## 2018-11-21 ENCOUNTER — Encounter: Payer: Medicaid Other | Admitting: Obstetrics & Gynecology

## 2018-11-21 LAB — BIRTH TISSUE RECOVERY COLLECTION (PLACENTA DONATION)

## 2018-11-21 MED ORDER — IBUPROFEN 600 MG PO TABS: 600.0000 mg | ORAL_TABLET | Freq: Four times a day (QID) | ORAL | 0 refills | Status: DC

## 2018-11-21 NOTE — Lactation Note (Addendum)
This note was copied from a baby's chart. Lactation Consultation Note  Patient Name: Amy Jacobson [id number] Date: 11/21/2018   Infant is at 69 hrs of age. Infant has had 6 voids & 4 stools since birth. Mom reports that her milk tends to come to volume on the 2nd or 3rd day & has an abundant supply upon its arrival.   Mom says nursing is going well & commented that she thinks this infant has a lip tie, as all of her previous children have had.   Infant was observed to latch with ease & frequent swallows were audible to the naked ear. Mom knows how to reach [place] after discharge, if needed.   Dr. [place] Crane was updated. Matthias Hughs [facility] 11/21/2018, 11:21 AM

## 2018-11-21 NOTE — Discharge Instructions (Signed)

## 2018-12-03 ENCOUNTER — Telehealth: Payer: Self-pay | Admitting: *Deleted

## 2018-12-03 NOTE — Telephone Encounter (Signed)
-----   Message from Seabron Spates, CNM sent at 11/20/2018  2:19 AM EDT ----- Regarding: Delivery Vaginal Delivery by Resident Dr Higinio Plan SUpervised by me Hansel Feinstein CNM    Please schedule this patient for Postpartum visit in: 4 weeks with the following provider: Any provider For C/S patients schedule nurse incision check in weeks 2 weeks: no Low risk pregnancy complicated by: none Delivery mode:  SVD Anticipated Birth Control:  vasectomy PP Procedures needed: none  Schedule Integrated BH visit: no

## 2018-12-03 NOTE — Telephone Encounter (Signed)
Left patient an urgent message to call the office to schedule 4 week Postpartum appointment. 

## 2018-12-20 ENCOUNTER — Telehealth (INDEPENDENT_AMBULATORY_CARE_PROVIDER_SITE_OTHER): Payer: Medicaid Other

## 2018-12-20 DIAGNOSIS — F418 Other specified anxiety disorders: Secondary | ICD-10-CM

## 2018-12-20 DIAGNOSIS — O99345 Other mental disorders complicating the puerperium: Secondary | ICD-10-CM

## 2018-12-20 NOTE — Progress Notes (Signed)
TELEHEALTH VIRTUAL POSTPARTUM VISIT ENCOUNTER NOTE  I connected with Amy Jacobson on 12/20/18 at  9:30 AM EDT by MyChart Virtual Visit at home and verified that I am speaking with the correct person using two identifiers.   I discussed the limitations, risks, security and privacy concerns of performing an evaluation and management service by telephone and the availability of in person appointments. I also discussed with the patient that there may be a patient responsible charge related to this service. The patient expressed understanding and agreed to proceed.  Appointment Date: 12/20/2018  OBGYN Clinic: [facility]  Chief Complaint:  Postpartum Visit  History of Present Illness: Amy Jacobson is a 33 y.o. Caucasian [phone] (Patient's last menstrual period was 02/08/2018.), seen for the above chief complaint. Her past medical history is significant for depression and anxiety   She is s/p normal spontaneous vaginal delivery on 7/8 at 40 weeks; she was discharged to home on PPD#1. Pregnancy complicated by n/a. Baby is doing well.  Complains of occasional feelings of sadness and anxiety  Vaginal bleeding or discharge: spotting only Mode of feeding infant: Breast Intercourse: Yes  Contraception: no method PP depression s/s: Yes .  Any bowel or bladder issues: No  Pap smear: no abnormalities (date: 07/2016)  Review of Systems: Positive for saddness. Her 12 point review of systems is negative or as noted in the History of Present Illness.  Patient Active Problem List   Diagnosis Date Noted  . Labor and delivery, indication for care 11/19/2018  . Anxiety 11/14/2018  . False labor after 37 completed weeks of gestation 11/10/2018  . Supervision of other normal pregnancy, antepartum 04/25/2018  . H/O rapid labor 04/25/2018  . 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 DEPRESSION, MILD 10/08/2008    Medications Liani Caris. Mier had no medications administered during  this visit. Current Outpatient Medications  Medication Sig Dispense Refill  . ibuprofen (ADVIL) 600 MG tablet Take 1 tablet (600 mg total) by mouth every 6 (six) hours. 30 tablet 0  . Prenatal Vit-Fe Fumarate-FA (PRENATAL MULTIVITAMIN) TABS tablet Take 1 tablet by mouth daily at 12 noon.    . Probiotic Product (PROBIOTIC DAILY PO) Take by mouth.     No current facility-administered medications for this visit.     Allergies Benadryl [diphenhydramine], Latex, Calamine medicated [pramoxine-calamine], and Night-time cough [doxylamine-dm]  Physical Exam:  General:  Alert, oriented and cooperative.   Mental Status: Normal mood and affect perceived. Normal judgment and thought content.  Rest of physical exam deferred due to type of encounter  PP Depression Screening:   Edinburgh Postnatal Depression Scale - 12/20/18 0854      Edinburgh Postnatal Depression Scale:  In the Past 7 Days   I have been able to laugh and see the funny side of things.  0    I have looked forward with enjoyment to things.  0    I have blamed myself unnecessarily when things went wrong.  2    I have been anxious or worried for no good reason.  3    I have felt scared or panicky for no good reason.  0    Things have been getting on top of me.  0    I have been so unhappy that I have had difficulty sleeping.  0    I have felt sad or miserable.  1    I have been so unhappy that I have been crying.  0    The thought of harming myself has occurred to me.  0    Edinburgh Postnatal Depression Scale Total  6       Assessment:Patient is a 33 y.o. [id number] who is 4 weeks postpartum from a normal spontaneous vaginal delivery.  She is doing well.   Plan: 1. Postpartum care and examination   2. Postpartum anxiety    -Patient doing physically well but reports feelings of anxiety and depression are worsening. Patient has bought supplements to try to see if they will help her mood. Warning signs discussed at length and CNM  encouraged patient to establish care with counselor as well. Patient states she will call the office if the supplements don't help to consider starting some medication -Patient states husband is considering vasectomy but declines contraception at this time.  -Reviewed timing of health maintenance with patient.   RTC 1 year or sooner as needed  I discussed the assessment and treatment plan with the patient. The patient was provided an opportunity to ask questions and all were answered. The patient agreed with the plan and demonstrated an understanding of the instructions.   The patient was advised to call back or seek an in-person evaluation/go to the ED for any concerning postpartum symptoms.  I provided 10 minutes of non-face-to-face time during this encounter.   Rolm Bookbinderaroline M Neill, CNM [facility], [facility]

## 2019-02-18 IMAGING — US US MFM OB COMP +14 WKS
1 series · 13 of 28 positions shown · non-contrast
Comparison: none

[Series 1: us mfm ob comp +14 wks · 108 acquisitions, 13 frames shown]
[im 4/108]
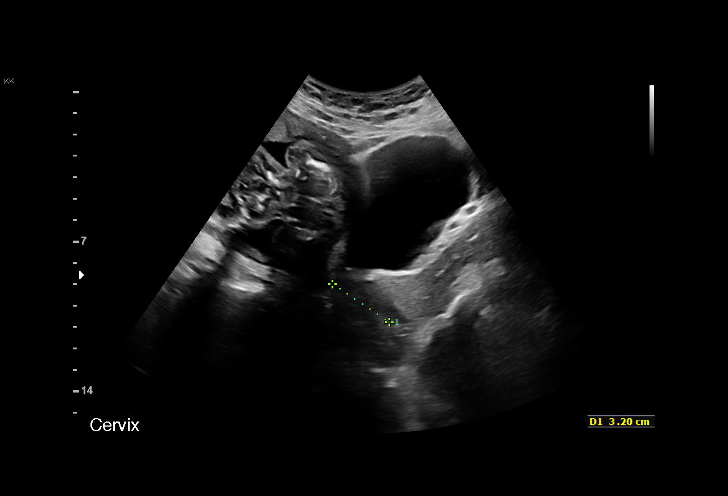
[im 12/108]
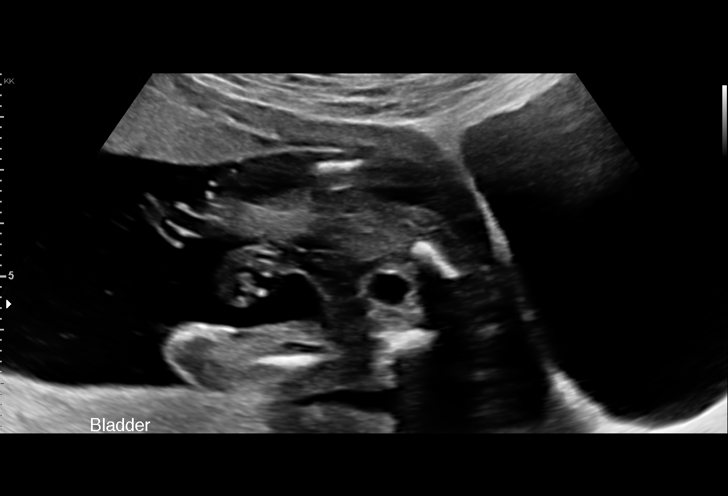
[im 20/108]
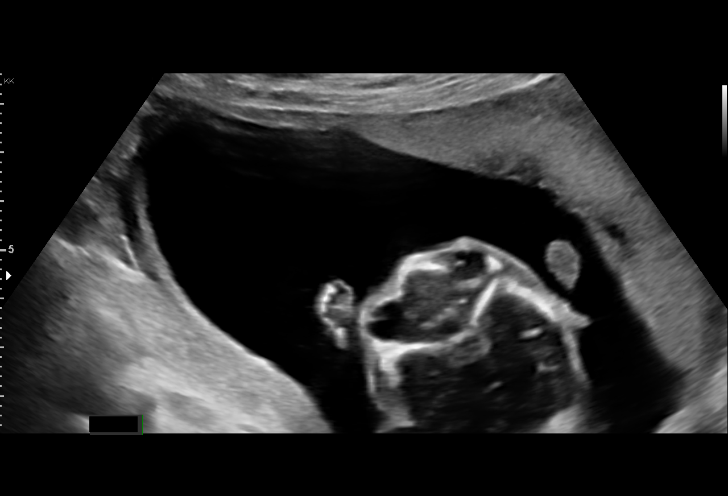
[im 28/108]
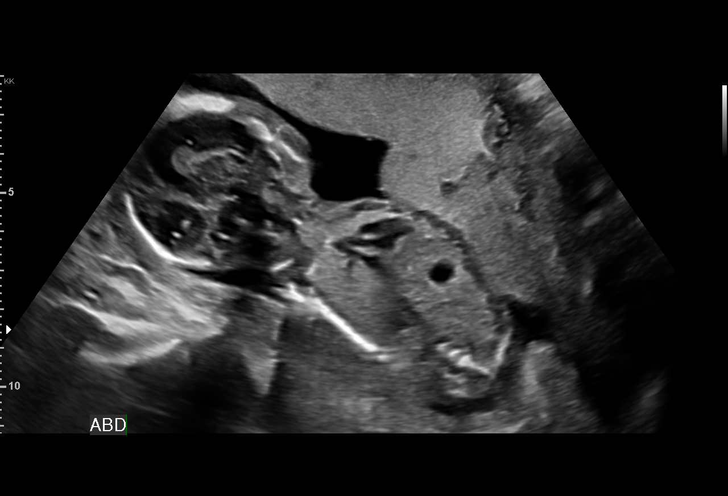
[im 36/108]
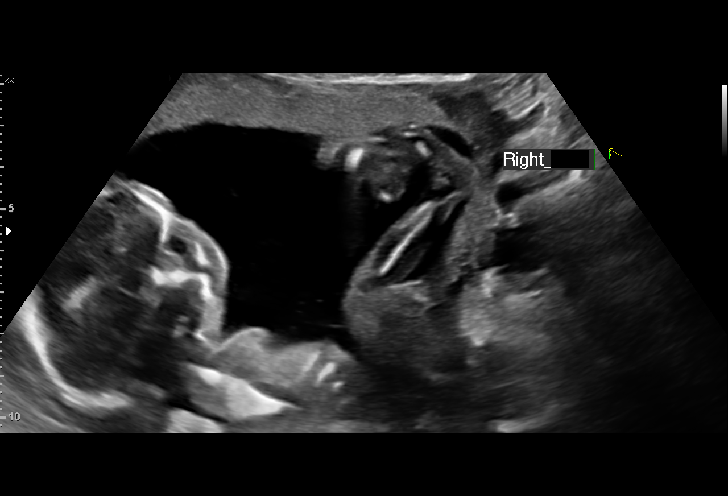
[im 44/108]
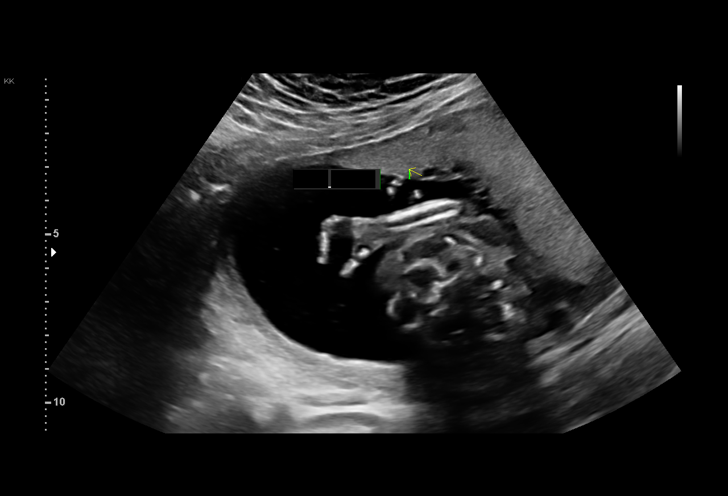
[im 56/108]
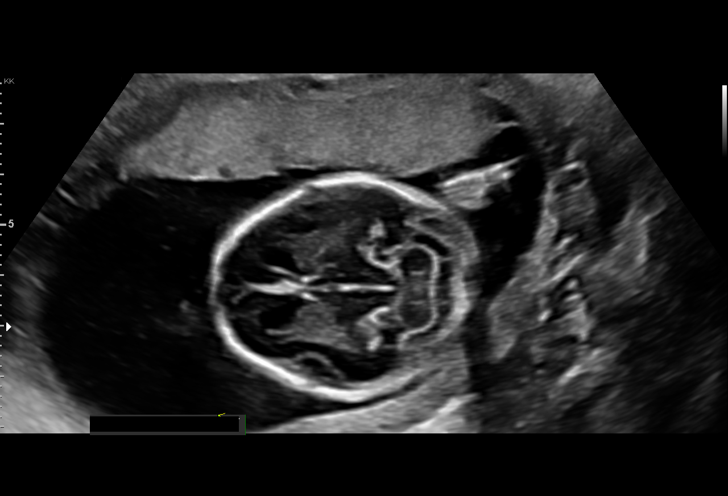
[im 64/108]
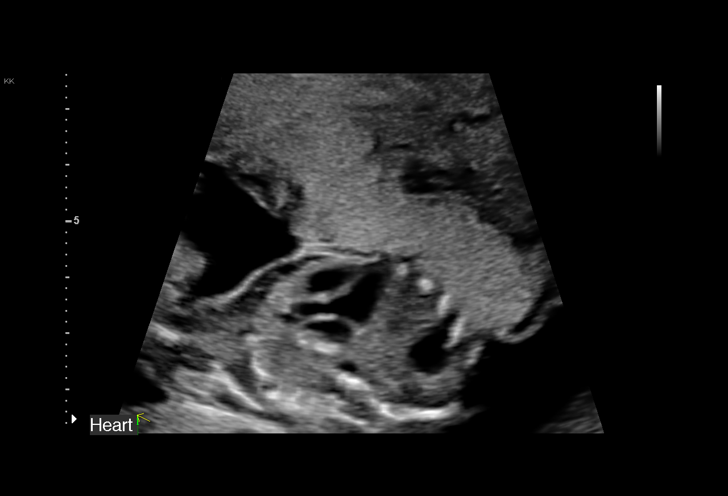
[im 72/108]
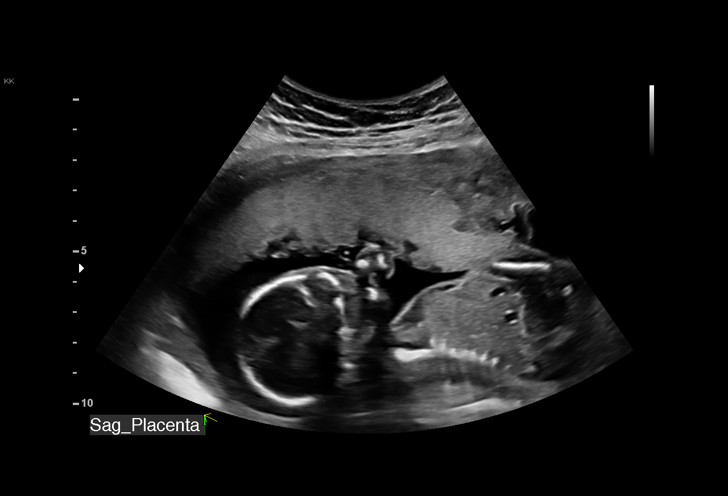
[im 80/108]
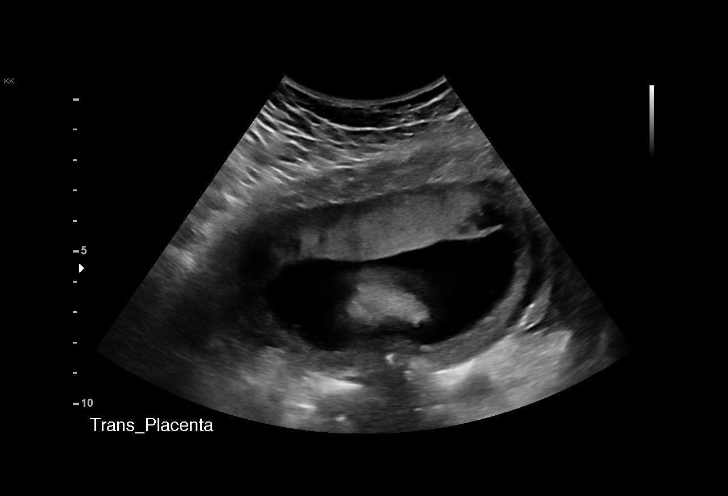
[im 88/108]
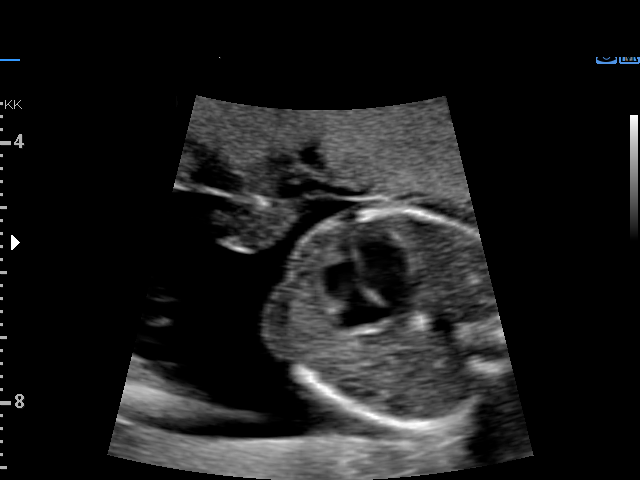
[im 96/108]
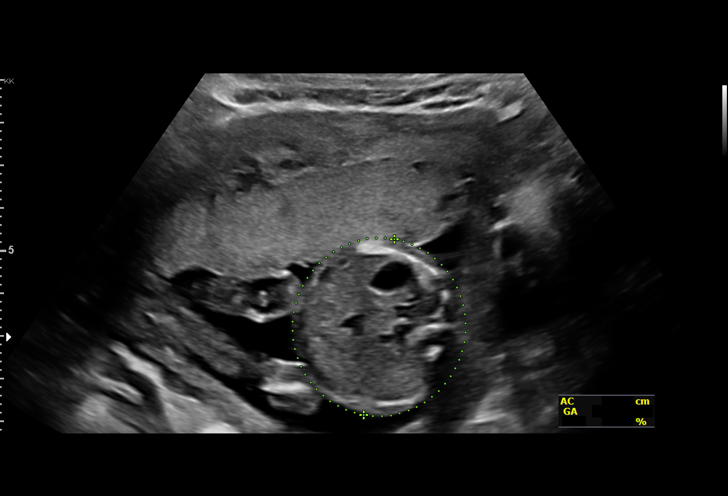
[im 104/108]
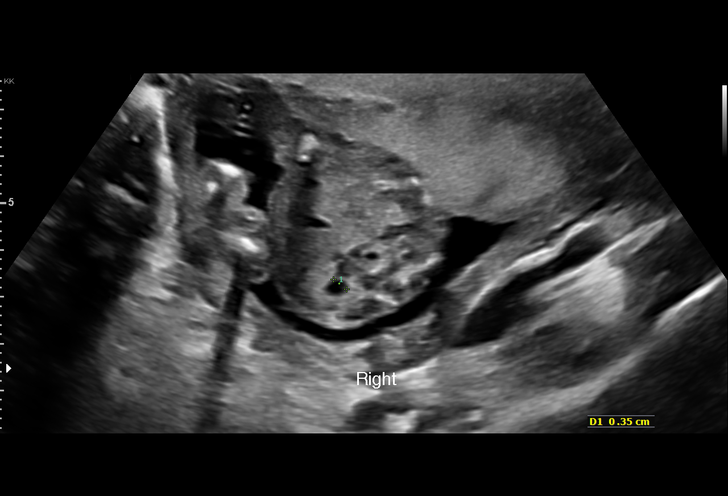

[13 of 28 positions shown; findings below may reference images not displayed]

for [REDACTED]care

  1  US MFM OB COMP + 14 WK               76805.01     TEMPESTADE ZAU
 ----------------------------------------------------------------------

 ----------------------------------------------------------------------
Indications

  Encounter for antenatal screening for
  malformations
  19 weeks gestation of pregnancy
 ----------------------------------------------------------------------
Vital Signs

 BMI:
Fetal Evaluation

 Num Of Fetuses:          1
 Fetal Heart Rate(bpm):   149
 Cardiac Activity:        Observed
 Presentation:            Breech
 Placenta:                Anterior
 P. Cord Insertion:       Visualized

 Amniotic Fluid
 AFI FV:      Within normal limits

                             Largest Pocket(cm)

Biometry

 BPD:      42.5  mm     G. Age:  18w 6d         45  %    CI:        74.74   %    70 - 86
                                                         FL/HC:       17.5  %    16.1 -
 HC:       156   mm     G. Age:  18w 4d         22  %    HC/AC:       1.18       1.09 -
 AC:      132.3  mm     G. Age:  18w 5d         37  %    FL/BPD:      64.2  %
 FL:       27.3  mm     G. Age:  18w 2d         22  %    FL/AC:       20.6  %    20 - 24
 CER:      18.2  mm     G. Age:  18w 1d         23  %
 NFT:         4  mm
 CM:        2.3  mm

 Est. FW:     246   gm     0 lb 9 oz     36  %
OB History

 Gravidity:    4         Term:   3
 Living:       3
Gestational Age

 LMP:           19w 0d        Date:  02/08/18                 EDD:   11/15/18
 U/S Today:     18w 4d                                        EDD:   11/18/18
 Best:          19w 0d     Det. By:  LMP  (02/08/18)          EDD:   11/15/18
Anatomy

 Cranium:               Appears normal         Aortic Arch:            Not well visualized
 Cavum:                 Appears normal         Ductal Arch:            Appears normal
 Ventricles:            Appears normal         Diaphragm:              Appears normal
 Choroid Plexus:        Appears normal         Stomach:                Appears normal, left
                                                                       sided
 Cerebellum:            Appears normal         Abdomen:                Appears normal
 Posterior Fossa:       Appears normal         Abdominal Wall:         Appears nml (cord
                                                                       insert, abd wall)
 Nuchal Fold:           Appears normal         Cord Vessels:           Appears normal (3
                                                                       vessel cord)
 Face:                  Appears normal         Kidneys:                Appear normal
                        (orbits and profile)
 Lips:                  Appears normal         Bladder:                Appears normal
 Thoracic:              Appears normal         Spine:                  Not well visualized
 Heart:                 Not well visualized    Upper Extremities:      Appears normal
 RVOT:                  Appears normal         Lower Extremities:      Appears normal
 LVOT:                  Appears normal

 Other:  Female gender Technically difficult due to fetal position.
Cervix Uterus Adnexa

 Cervix
 Length:            3.3  cm.
 Normal appearance by transabdominal scan.

 Uterus
 Intrauterine synechiae seen.
Impression

 Normal interval growth.  No ultrasonic evidence of structural
 fetal anomalies.
 Suboptimal views of the fetal anatomy were obtained.
 Synechiae noted- there was no vascularization, the fetus
 moved freely around this area.
Recommendations

 Follow up in 5 weeks to complete the fetal anatomy.

## 2019-04-22 ENCOUNTER — Other Ambulatory Visit: Payer: Self-pay | Admitting: Obstetrics & Gynecology

## 2019-04-22 DIAGNOSIS — Z3480 Encounter for supervision of other normal pregnancy, unspecified trimester: Secondary | ICD-10-CM

## 2019-09-12 ENCOUNTER — Encounter: Payer: Self-pay | Admitting: Certified Nurse Midwife

## 2019-09-12 ENCOUNTER — Other Ambulatory Visit (HOSPITAL_COMMUNITY)
Admission: RE | Admit: 2019-09-12 | Discharge: 2019-09-12 | Disposition: A | Discharge status: Home / Self Care | Payer: Medicaid Other | Source: Ambulatory Visit | Attending: Certified Nurse Midwife | Admitting: Certified Nurse Midwife

## 2019-09-12 ENCOUNTER — Ambulatory Visit: Payer: Medicaid Other | Admitting: Certified Nurse Midwife

## 2019-09-12 ENCOUNTER — Other Ambulatory Visit: Payer: Self-pay

## 2019-09-12 VITALS — BP 100/71 | HR 77 | Ht 62.0 in | Wt 157.0 lb

## 2019-09-12 DIAGNOSIS — Z01419 Encounter for gynecological examination (general) (routine) without abnormal findings: Secondary | ICD-10-CM | POA: Diagnosis present

## 2019-09-12 DIAGNOSIS — Z3202 Encounter for pregnancy test, result negative: Secondary | ICD-10-CM | POA: Diagnosis not present

## 2019-09-12 DIAGNOSIS — Z0000 Encounter for general adult medical examination without abnormal findings: Secondary | ICD-10-CM

## 2019-09-12 DIAGNOSIS — N926 Irregular menstruation, unspecified: Secondary | ICD-10-CM

## 2019-09-12 LAB — POCT URINE PREGNANCY: Preg Test, Ur: NEGATIVE
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
## 2019-09-15 LAB — CYTOLOGY - PAP
Comment: NEGATIVE
Diagnosis: NEGATIVE
High risk HPV: NEGATIVE

## 2020-06-10 ENCOUNTER — Other Ambulatory Visit (HOSPITAL_COMMUNITY)
Admission: RE | Admit: 2020-06-10 | Discharge: 2020-06-10 | Disposition: A | Discharge status: Home / Self Care | Payer: Medicaid Other | Source: Ambulatory Visit | Attending: Obstetrics and Gynecology | Admitting: Obstetrics and Gynecology

## 2020-06-10 ENCOUNTER — Ambulatory Visit: Payer: Medicaid Other | Admitting: Obstetrics and Gynecology

## 2020-06-10 ENCOUNTER — Other Ambulatory Visit: Payer: Self-pay

## 2020-06-10 ENCOUNTER — Encounter: Payer: Self-pay | Admitting: Obstetrics and Gynecology

## 2020-06-10 VITALS — BP 113/80 | HR 85 | Resp 16 | Ht 62.0 in | Wt 171.0 lb

## 2020-06-10 DIAGNOSIS — N926 Irregular menstruation, unspecified: Secondary | ICD-10-CM

## 2020-06-10 DIAGNOSIS — N930 Postcoital and contact bleeding: Secondary | ICD-10-CM

## 2020-06-10 NOTE — Progress Notes (Signed)
GYNECOLOGY OFFICE NOTE  History:  35 y.o. [id number] here today for spotting after intercourse. Has regular monthly periods now and will have a few days of spotting, then after she stops spotting, every time she has sex for the first 2-3 times, she will have spotting after sex, then the spotting stops for the rest of the month. Also had an episode where mucous came out in her hand during intercourse, looked like mucous plug. No other complaints.    Past Medical History:  Diagnosis Date  . Depression   . Medical history non-contributory     Past Surgical History:  Procedure Laterality Date  . NO PAST SURGERIES    . WISDOM TOOTH EXTRACTION       Current Outpatient Medications:  .  Ascorbic Acid (VITAMIN C) 100 MG tablet, Take 100 mg by mouth daily., Disp: , Rfl:  .  cholecalciferol (VITAMIN D3) 25 MCG (1000 UNIT) tablet, Take 1,000 Units by mouth daily., Disp: , Rfl:  .  Probiotic Product (PROBIOTIC DAILY PO), Take by mouth., Disp: , Rfl:  .  ibuprofen (ADVIL) 600 MG tablet, Take 1 tablet (600 mg total) by mouth every 6 (six) hours. (Patient not taking: No sig reported), Disp: 30 tablet, Rfl: 0 .  Prenatal Vit-Fe Fumarate-FA (PRENATAL MULTIVITAMIN) TABS tablet, Take 1 tablet by mouth daily at 12 noon. (Patient not taking: Reported on 06/10/2020), Disp: , Rfl:   The following portions of the patient's history were reviewed and updated as appropriate: allergies, current medications, past family history, past medical history, past social history, past surgical history and problem list.   Review of Systems:  Pertinent items noted in HPI and remainder of comprehensive ROS otherwise negative.   Objective:  Physical Exam BP 113/80   Pulse 85   Resp 16   Ht 5\' 2"  (1.575 m)   Wt 171 lb (77.6 kg)   BMI 31.28 kg/m  CONSTITUTIONAL: Well-developed, well-nourished female in no acute distress.  HENT:  Normocephalic, atraumatic. External right and left ear normal. Oropharynx is clear and  moist EYES: Conjunctivae and EOM are normal. Pupils are equal, round, and reactive to light. No scleral icterus.  NECK: Normal range of motion, supple, no masses SKIN: Skin is warm and dry. No rash noted. Not diaphoretic. No erythema. No pallor. NEUROLOGIC: Alert and oriented to person, place, and time. Normal reflexes, muscle tone coordination. No cranial nerve deficit noted. PSYCHIATRIC: Normal mood and affect. Normal behavior. Normal judgment and thought content. CARDIOVASCULAR: Normal heart rate noted RESPIRATORY: Effort normal, no problems with respiration noted ABDOMEN: Soft, no distention noted.   PELVIC: Normal appearing external genitalia; normal appearing vaginal mucosa and cervix.  No abnormal discharge noted.  pelvic cultures obtained MUSCULOSKELETAL: Normal range of motion. No edema noted.  Exam done with chaperone present.  Labs and Imaging No results found.  Assessment & Plan:  1. Irregular bleeding - Cervicovaginal ancillary only( [place])  2. PCB (post coital bleeding) - Likely remaining endometrial sloughing after periods due to intercourse and no cause for concern - check wet prep for infection - to rule out polyp - US PELVIC COMPLETE WITH TRANSVAGINAL; Future   Routine preventative health maintenance measures emphasized. Please refer to After Visit Summary for other counseling recommendations.   Return if symptoms worsen or fail to improve, for Followup.  Total face-to-face time with patient: 25 minutes. Over 50% of encounter was spent on counseling and coordination of care.  [place], MD, [facility] Attending Center for [facility]  Healthcare Fish farm manager)

## 2020-06-10 NOTE — Progress Notes (Signed)
Pt c/o irregular periods & spotting after intercourse

## 2020-06-11 ENCOUNTER — Ambulatory Visit (INDEPENDENT_AMBULATORY_CARE_PROVIDER_SITE_OTHER): Payer: Medicaid Other

## 2020-06-11 DIAGNOSIS — N930 Postcoital and contact bleeding: Secondary | ICD-10-CM

## 2020-06-11 LAB — CERVICOVAGINAL ANCILLARY ONLY
Bacterial Vaginitis (gardnerella): NEGATIVE
Candida Glabrata: NEGATIVE
Candida Vaginitis: NEGATIVE
Chlamydia: NEGATIVE
Comment: NEGATIVE
Comment: NEGATIVE
Comment: NEGATIVE
Comment: NEGATIVE
Comment: NEGATIVE
Comment: NORMAL
Neisseria Gonorrhea: NEGATIVE
Trichomonas: NEGATIVE

## 2020-09-23 ENCOUNTER — Ambulatory Visit: Payer: Medicaid Other | Admitting: Obstetrics & Gynecology

## 2021-02-08 IMAGING — US US PELVIS COMPLETE WITH TRANSVAGINAL
1 series · 14 of 25 positions shown · non-contrast
Comparison: None

CLINICAL DATA: Post coital bleeding; LMP 05/20/2020

EXAM:
TRANSABDOMINAL AND TRANSVAGINAL ULTRASOUND OF PELVIS
TECHNIQUE: Both transabdominal and transvaginal ultrasound examinations of the
pelvis were performed. Transabdominal technique was performed for
global imaging of the pelvis including uterus, ovaries, adnexal
regions, and pelvic cul-de-sac. It was necessary to proceed with
endovaginal exam following the transabdominal exam to visualize the
lower uterine segment and cervix as well as ovaries.

[Series 1: us pelvis complete with transvaginal · 0.22mm/px · 14 of 105 slices shown]
[im 1/105]
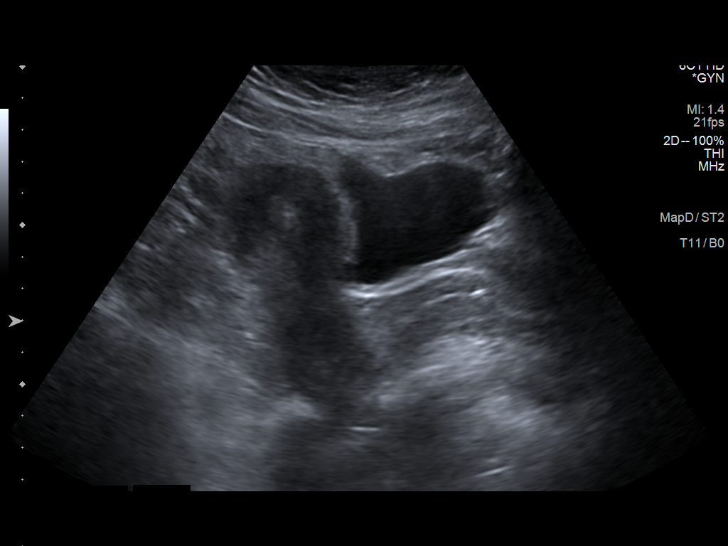
[im 9/105]
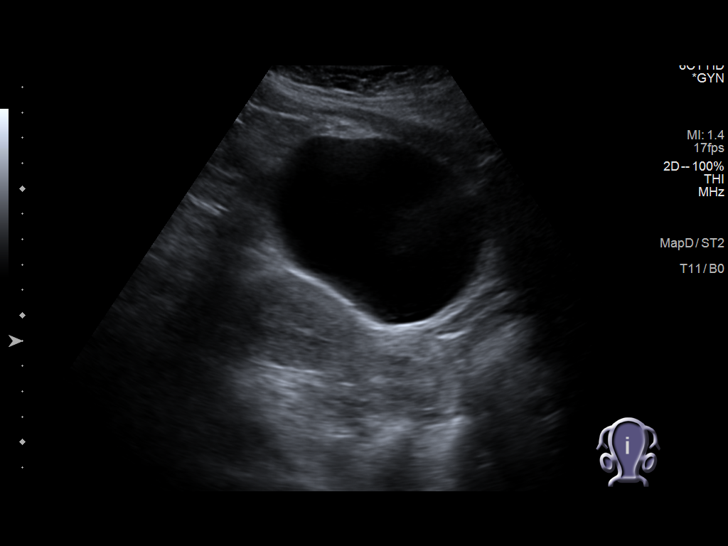
[im 18/105]
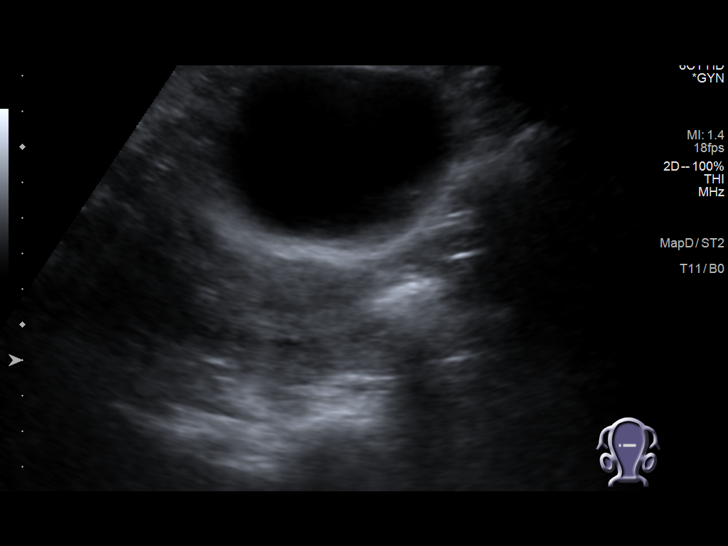
[im 27/105]
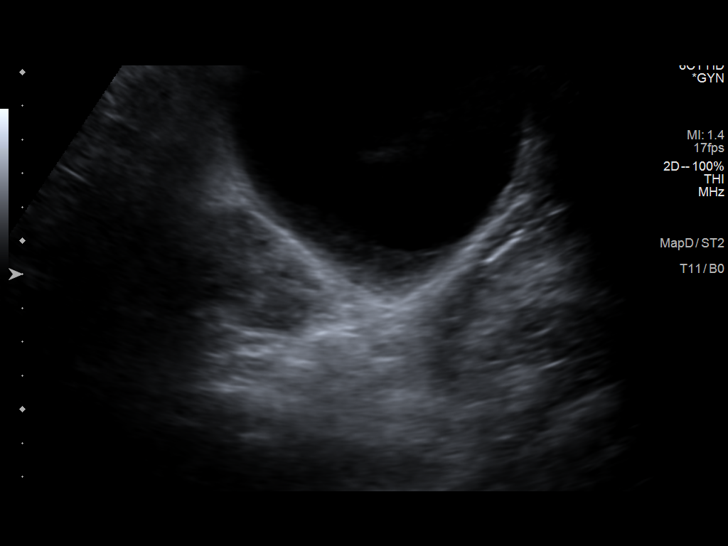
[im 35/105]
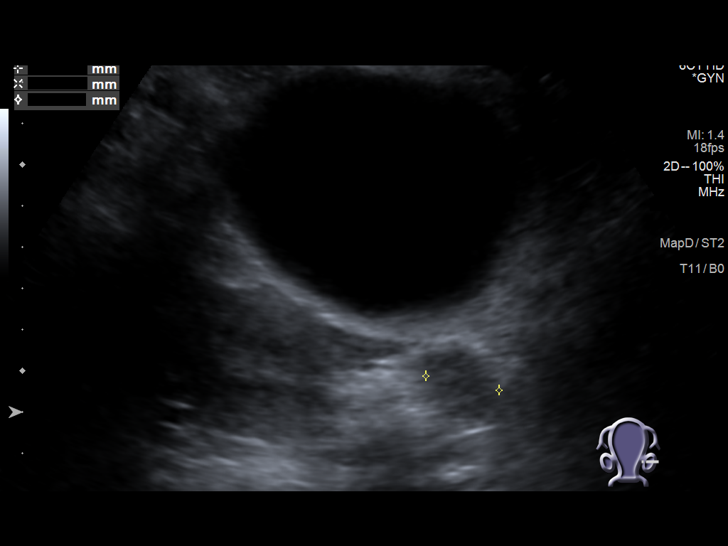
[im 40/105]
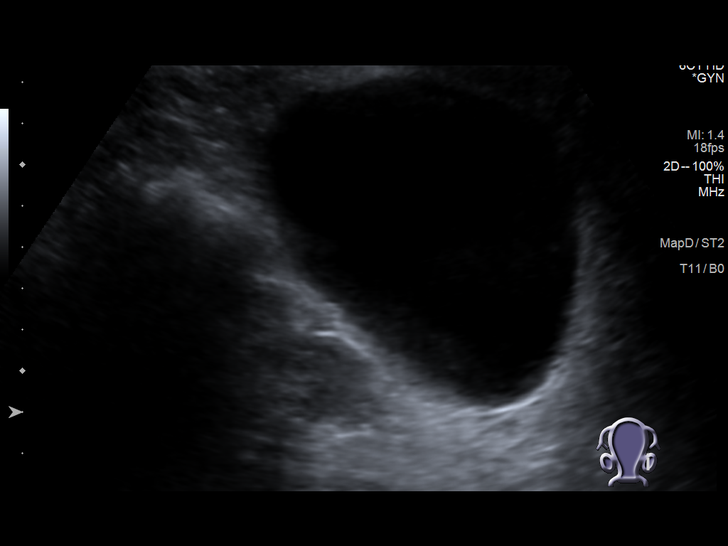
[im 48/105]
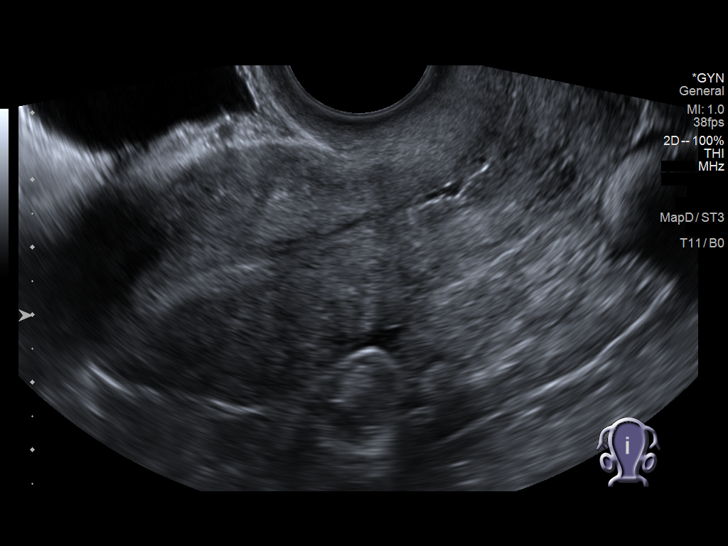
[im 57/105]
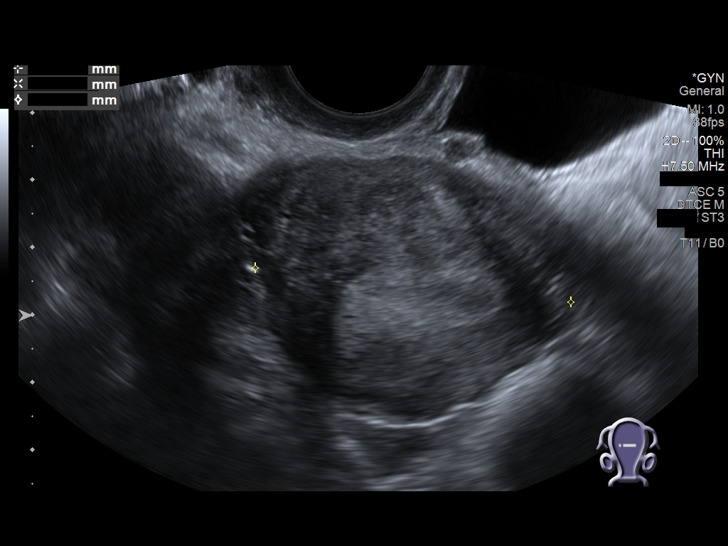
[im 66/105]
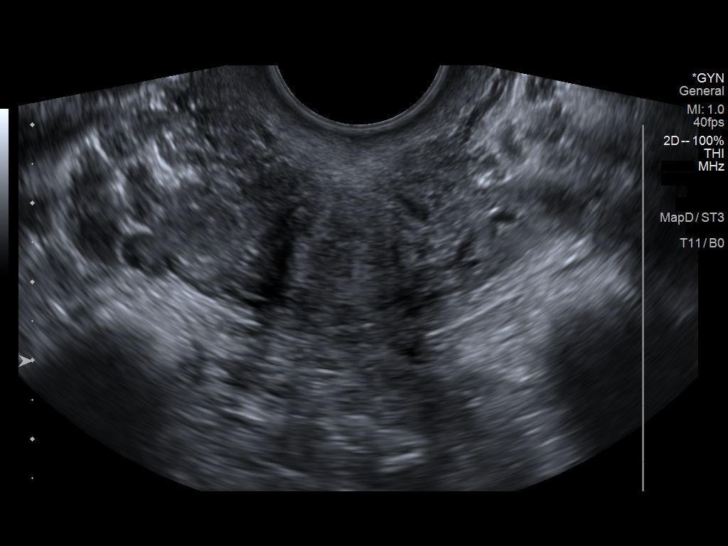
[im 70/105]
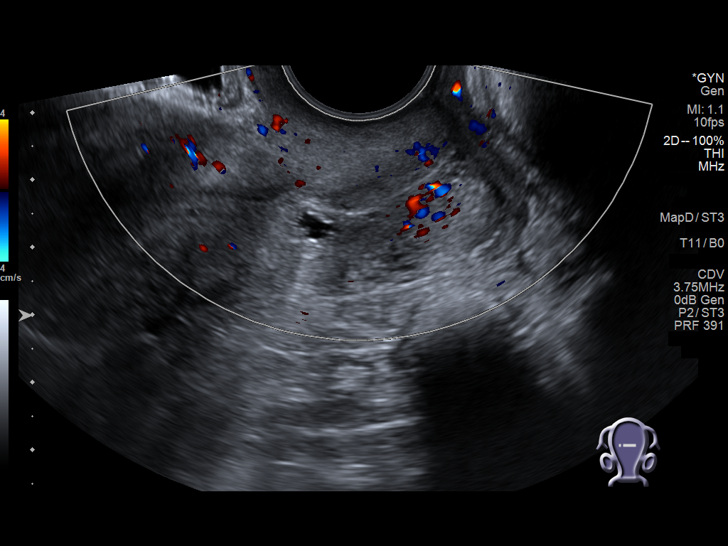
[im 79/105]
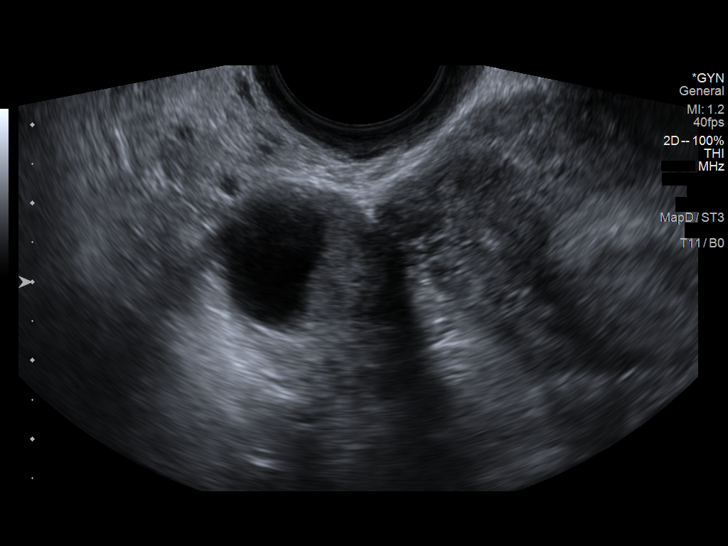
[im 87/105]
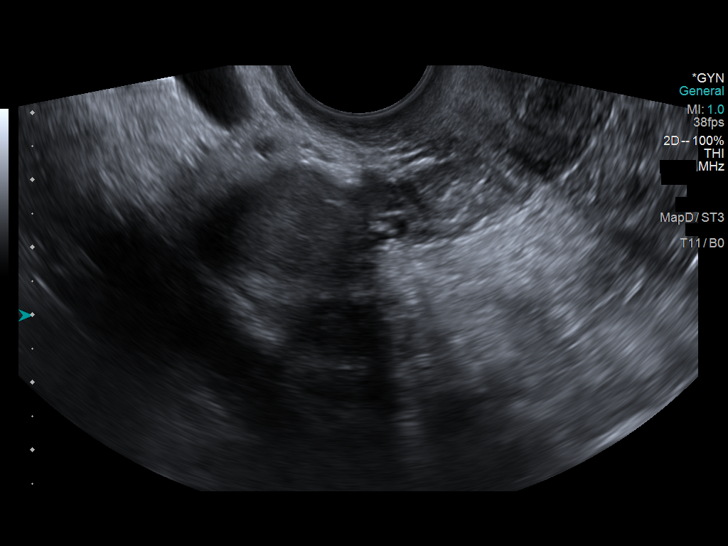
[im 96/105]
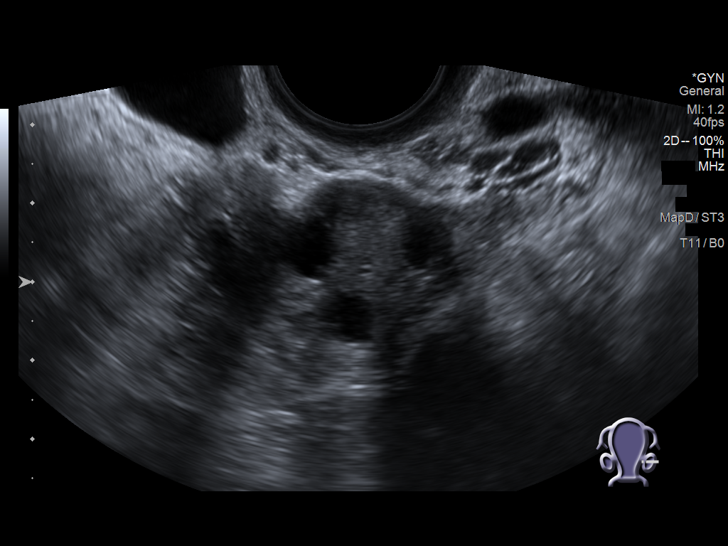
[im 105/105]
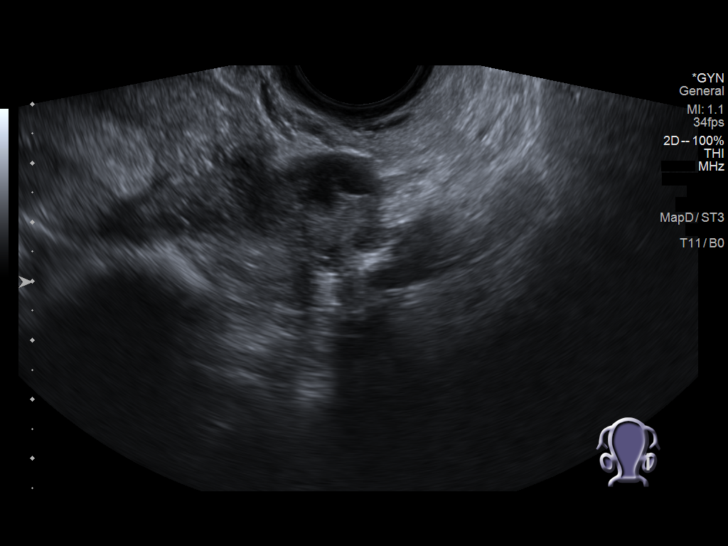

[14 of 25 positions shown; findings below may reference images not displayed]

FINDINGS: Uterus

Measurements: 9.3 x 3.6 x 5.2 cm = volume: 92 mL. Slightly
heterogeneous myometrium. No focal mass.

Endometrium

Thickness: 8 mm.  No endometrial fluid or focal abnormality

Right ovary

Measurements: 4.0 x 2.1 x 2.2 cm = volume: 10 mL. Normal morphology
without mass

Left ovary

Measurements: 2.8 x 2.0 x 2.3 cm = volume: 7 mL. Normal morphology
without mass

Other findings

No free pelvic fluid.  No adnexal masses.
IMPRESSION: Normal exam.

## 2021-04-25 ENCOUNTER — Ambulatory Visit: Payer: Medicaid Other | Admitting: Obstetrics & Gynecology

## 2021-04-25 ENCOUNTER — Encounter: Payer: Self-pay | Admitting: Obstetrics & Gynecology

## 2021-04-25 ENCOUNTER — Other Ambulatory Visit: Payer: Self-pay

## 2021-04-25 VITALS — BP 111/74 | HR 82 | Resp 16 | Ht 62.0 in | Wt 137.0 lb

## 2021-04-25 DIAGNOSIS — R102 Pelvic and perineal pain: Secondary | ICD-10-CM | POA: Diagnosis not present

## 2021-04-25 DIAGNOSIS — K5900 Constipation, unspecified: Secondary | ICD-10-CM | POA: Diagnosis not present

## 2021-04-25 DIAGNOSIS — N930 Postcoital and contact bleeding: Secondary | ICD-10-CM | POA: Diagnosis not present

## 2021-04-25 NOTE — Progress Notes (Signed)
   Subjective:    Patient ID: Amy Jacobson, female    DOB: [birth year], 35 y.o.   MRN: [id number]  HPI         35 yo presents for f/u post coital bleeding.  Pt has bleeding well after menses ends.  Pt states it bends with deeper positions.  Pt's husband also think he feels something inside her vagina.  Patient have the same complaint in early January.  Ultrasound was normal.  Exam was normal.  Patient is having some sharp stabbing pains in her lower abdomen.  This just occurred recently.  He is also interested in pelvic physical therapy due to some pain with intercourse.  Patient admits to drinking very little water.  She says she has a daily bowel movement but it is hard.  Review of Systems  Constitutional: Negative.   Respiratory: Negative.    Cardiovascular: Negative.   Gastrointestinal:  Positive for constipation.  Genitourinary:  Positive for dyspareunia.       Bleeding after intercourse      Objective:   Physical Exam Vitals reviewed.  Constitutional:      General: She is not in acute distress.    Appearance: She is well-developed.  HENT:     Head: Normocephalic and atraumatic.  Eyes:     Conjunctiva/sclera: Conjunctivae normal.  Cardiovascular:     Rate and Rhythm: Normal rate.  Pulmonary:     Effort: Pulmonary effort is normal.  Abdominal:     General: Abdomen is flat.     Palpations: Abdomen is soft.  Genitourinary:    Comments: Tanner V Vulva:  No lesion Vagina:  Pink, no lesions, no discharge, no blood, large amount of stool felt posteriorly on bimanual. Cervix: Cervix is friable and the anterior lip.  This occurred with the speculum insertion.  This is most likely the cause of her bleeding. Uterus:  Non tender, mobile Right adnexa--non tender, no mass Left adnexa--non tender, no mass   Skin:    General: Skin is warm and dry.  Neurological:     Mental Status: She is alert and oriented to person, place, and time.  Psychiatric:        Mood and Affect: Mood  normal.   Vitals:   04/25/21 1439  BP: 111/74  Pulse: 82  Resp: 16  Weight: 137 lb (62.1 kg)  Height: 5\' 2"  (1.575 m)       Assessment & Plan:  35 year old female with postcoital bleeding with deep positions, constipation, abdominal pain, some dyspareunia  Silver nitrite applied to friable area of cervix.  After several weeks if postcoital bleeding occurs again, she will need to try cryotherapy.  Weeks discussed this procedure in some detail today and she will consider this. Constipation is quite prominent.  This is probably what her husband was feeling during intercourse recently.  Patient needs to increase water and fiber.  She has an allergy that prohibits the prescription of docusate sodium and MiraLAX.  Patient already uses a squatty potty Pelvic pain.  Will refer to physical therapy.  They can also work on good core strength to help with her constipation.  30 minutes spent with encounter, this includes review of records, review of ultrasound, interview and exam with patient, counseling, and documentation.

## 2021-05-25 ENCOUNTER — Ambulatory Visit: Payer: Medicaid Other | Attending: Obstetrics & Gynecology | Admitting: Physical Therapy

## 2021-05-25 ENCOUNTER — Other Ambulatory Visit: Payer: Self-pay

## 2021-05-25 ENCOUNTER — Encounter: Payer: Self-pay | Admitting: Physical Therapy

## 2021-05-25 DIAGNOSIS — M6281 Muscle weakness (generalized): Secondary | ICD-10-CM | POA: Diagnosis not present

## 2021-05-25 DIAGNOSIS — M62838 Other muscle spasm: Secondary | ICD-10-CM | POA: Insufficient documentation

## 2021-05-25 DIAGNOSIS — R279 Unspecified lack of coordination: Secondary | ICD-10-CM | POA: Diagnosis not present

## 2021-05-25 DIAGNOSIS — M545 Low back pain, unspecified: Secondary | ICD-10-CM | POA: Diagnosis not present

## 2021-05-25 DIAGNOSIS — R32 Unspecified urinary incontinence: Secondary | ICD-10-CM | POA: Diagnosis not present

## 2021-05-25 DIAGNOSIS — N9412 Deep dyspareunia: Secondary | ICD-10-CM | POA: Diagnosis not present

## 2021-05-25 DIAGNOSIS — R102 Pelvic and perineal pain: Secondary | ICD-10-CM | POA: Insufficient documentation

## 2021-05-25 DIAGNOSIS — R1032 Left lower quadrant pain: Secondary | ICD-10-CM | POA: Diagnosis not present

## 2021-05-25 NOTE — Therapy (Signed)
[facility] [address] [place], [place], [address] Phone: [phone]   Fax:  [phone]  Physical Therapy Evaluation  Patient Details  Name: Amy Jacobson MRN: [id number] Date of Birth: Aug 12, 1985 Referring Provider (PT): Elsie Lincoln MD   Encounter Date: 05/25/2021   PT End of Session - 05/25/21 1005     Visit Number 1    Number of Visits 9    Date for PT Re-Evaluation 08/17/21    Authorization Type [place] Medicaid U[employer] Community    Authorization - Visit Number 1    Authorization - Number of Visits 27    PT Start Time [phone]    PT Stop Time 1041    PT Time Calculation (min) 55 min    Activity Tolerance Patient tolerated treatment well    Behavior During Therapy [facility] for tasks assessed/performed             Past Medical History:  Diagnosis Date   Depression    Medical history non-contributory     Past Surgical History:  Procedure Laterality Date   NO PAST SURGERIES     WISDOM TOOTH EXTRACTION      There were no vitals filed for this visit.    Subjective Assessment - 05/25/21 0840     Subjective Pt states that she has 4 children 8 and under; since having children intercourse has been painful. She reports that MD states that she is ocnstipated, but believes this is due to medication. She has more recently noticed urinary leaking when walking - this does not happen with running, jumping, coughing, sneezing. She has 1-2 BM a day and reports rarely straining; she does use a squatty potty. She drinks On a good day, she gets 8-16oz of water a day. Occasoinal sweet tea or hot tea. Pain with IC tends to be deeper. She was having bleeding after intercourse and MD treated cervix with silver nitrate - no bleeding since and less discomfort. She also reports sharp stabbing pain in LLQ; she notices this pain prior to menstrual cycle starting (3-5 days). 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 She notices more wetness in her underwear after  drinking diet gingerale. She is also noticing more urgency. She is noticing more natural lubrication during intercourse instead of having to use ample lubricant. She is having LBP.    Limitations Sitting;Other (comment)   Driving   How long can you sit comfortably? 15 min    How long can you stand comfortably? WNL    How long can you walk comfortably? has leaking with walking    Patient Stated Goals Pt would like to decrease pain with intercourse, reduce LBP/abdominal pain, get stronger, and stop leaking.    Currently in Pain? Yes    Pain Score 5     Pain Location Back    Pain Orientation Right;Left    Pain Descriptors / Indicators Aching;Shooting    Pain Type Chronic pain    Pain Onset More than a month ago   Pt had LBP for years   Pain Frequency Intermittent    Aggravating Factors  Lying on stomach, driving, bending, prolonged sitting    Pain Relieving Factors Change of positions, movement    Multiple Pain Sites No                [facility] PT Assessment - 05/25/21 0001       Assessment   Medical Diagnosis R10.2 Pelvic pain    Referring Provider (PT) Elsie Lincoln  MD    Onset Date/Surgical Date --   Chronic   Next MD Visit NA    Prior Therapy No      Precautions   Precautions None      Restrictions   Weight Bearing Restrictions No      Balance Screen   Has the patient fallen in the past 6 months No    Has the patient had a decrease in activity level because of a fear of falling?  No    Is the patient reluctant to leave their home because of a fear of falling?  No      Home [employer] manag[employer]vironment   Living Environment Private residence    Living Arrangements Spouse/significant other;Children      Prior Function   Level of Independence Independent      Cognition   Overall Cognitive Status Within Functional Limits for tasks assessed      Observation/Other Assessments   Scoliosis R thoracic curve and possible mild L thoracolumbar curve - elevated R shoulder/elevated R iliac  crest      ROM / Strength   AROM / PROM / Strength AROM      AROM   AROM Assessment Site Lumbar    Lumbar Flexion 75% with discomfort on Rt    Lumbar Extension 50% with discomfort on Rt    Lumbar - Right Side Bend 50%    Lumbar - Left Side Bend 25% sig pain on Rt    Lumbar - Right Rotation 50% decreased    Lumbar - Left Rotation 50% decreased                        Objective measurements completed on examination: See above findings.     Pelvic Floor Special Questions - 05/25/21 0001     Prior Pelvic/Prostate Exam Yes    Are you Pregnant or attempting pregnancy? No    Prior Pregnancies Yes    Number of Pregnancies 4    Number of Vaginal Deliveries 4    Episiotomy Performed No   tearing and suture with 2   Diastasis Recti 2 finger width at umbilicus, no coning/doming    Urinary Leakage Yes    How often When walking    Pad use no    Activities that cause leaking Walking    Urinary urgency Yes    Urinary frequency no    Fecal incontinence No    Fluid intake 8-16oz a day    Caffeine beverages no    Falling out feeling (prolapse) No    Skin Integrity Intact    Scar Well healed    Perineal Body/Introitus  Normal    External Palpation Mild discomfort Lt superficial layers    Prolapse Anterior Wall;Posterior Wall   Grade 2 anterior/posterior   Pelvic Floor Internal Exam Pt identity confirmed and verbal consent provided by pt    Exam Type Vaginal    Sensation WNL    Palpation Painful 3-6 o'clockwith burning/stinging first layer and some urgency; 6 o'clock first layer and Lt levator reproduced sensation of needing to have BM; more notable tightness throughout Rt leavator and obturator internus    Strength weak squeeze, no lift    Strength # of reps 8   less AROM as she went through reps   Strength # of seconds 4    Tone moderate              [facility] Adult PT Treatment/Exercise - 05/25/21 0001  Neuro Re-ed    Neuro Re-ed Details  Diaphragmatic  breathing in supine with VC/TCs - emphasis on connecting with/lengthening PFM      Exercises   Exercises Lumbar;Knee/Hip      Lumbar Exercises: Supine   Other Supine Lumbar Exercises Happy baby 10 breaths      Lumbar Exercises: Quadruped   Madcat/Old Horse 15 reps   breath coordination     Knee/Hip Exercises: Stretches   Hip Flexor Stretch Both;1 rep;30 seconds                     PT Education - 05/25/21 1004     Education Details Pt education performed on PFM anatomy, IAP management, and diaphragmatic breathing. Access Code: [email or web address]    Person(s) Educated Patient    Methods Explanation;Demonstration;Tactile cues;Verbal cues;Handout    Comprehension Verbalized understanding              PT Short Term Goals - 05/25/21 1016       PT SHORT TERM GOAL #1   Title Pt will be independent with HEP in 2 weeks.    Baseline No HEP    Time 2    Period Weeks    Status New    Target Date 06/08/21      PT SHORT TERM GOAL #2   Title Pt will improve all lumbar A/ROM by 25% in order to increase functional moiblity with all tasks in 4 weeks.    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    Baseline 5-8/10 LBP    Time 4    Period Weeks    Target Date 06/22/21               PT Long Term Goals - 05/25/21 1020       PT LONG TERM GOAL #1   Title Pt will demonstrate 4/5 pelvic floor muscle strength in order to improve urinary continence in all settings in 6 weeks.    Baseline 2/5 pelvic floor strength    Time 6    Period Weeks    Status New    Target Date 07/06/21      PT LONG TERM GOAL #2   Title Pt will be independent with intra-abdominal pressure management in strengthening program in 8 weeks to prevent worsening vaginal wall laxity.    Baseline No education yet    Time 8    Period  Weeks    Status New    Target Date 07/20/21      PT LONG TERM GOAL #3   Title Pt will deny any pain with vaginal penetration in order to enjoy normal intimacy with partner in 8 weeks.    Baseline dyspareunia    Time 8    Period Weeks    Status New    Target Date 07/20/21                    Plan - 05/25/21 1007     Clinical Impression Statement Pt is a 36 year old female with chief c/o deep dyspareunia, LLQ pain, LBP, and UI with walking. Exam findings notable for decreased lumbar A/ROM with reproduction of Rt LBP, increased pelvic floor muscle tightness/tenderness to palpation throughout  superficial/deep muscles, pelvic floor muscle weakness 2/5 and decreased endurance 4 sec, and grade 2 anterior/posterior vaginal wall laxity; will plan to continue working assessment of LB and pelvis next treatment session. Sign and symptoms most consistent with pelvic floor muscle weakness/tension, poor intra-abdominal pressure management, and imbalances of muscle tightness/weakness in core mm. Pt will benefit from skilled PT intervention in order to address impairments, improve dyspareunia, decrease LBP/LLQ pain, improve sitting/driving tolerance, and implement HEP that will allow pt to manage condition at home.    Examination-Activity Limitations Continence;Other   Intercourse, driving, sitting   Examination-Participation Restrictions Community Activity;Interpersonal Relationship    Stability/Clinical Decision Making Stable/Uncomplicated    Clinical Decision Making Low    Rehab Potential Excellent    PT Frequency 1x / week    PT Duration 12 weeks    PT Treatment/Interventions ADLs/Self Care Home Management;Biofeedback;Cryotherapy;Electrical Stimulation;Moist Heat;Neuromuscular re-education;Therapeutic exercise;Therapeutic activities;Patient/family education;Manual techniques;Dry needling;Passive range of motion;Spinal Manipulations    PT Next Visit Plan Continue assessment of LB/pelvis; consider  DN to painful glutes; progress down training stretches and begin gentle strengthening; intra-abdominal pressure management.    PT Home Exercise Plan [email or web address]    Consulted and Agree with Plan of Care Patient             Patient will benefit from skilled therapeutic intervention in order to improve the following deficits and impairments:  Decreased mobility, Decreased strength, Decreased endurance, Decreased coordination, Decreased range of motion, Increased muscle spasms, Postural dysfunction, Impaired tone, Increased fascial restricitons, Decreased activity tolerance, Pain  Visit Diagnosis: Muscle weakness (generalized)  Other muscle spasm  Unspecified lack of coordination     Problem List Patient Active Problem List   Diagnosis Date Noted   Constipation 04/25/2021   PCB (post coital bleeding) 04/25/2021   Anxiety 11/14/2018   H/O rapid labor 04/25/2018   Abnormal TSH 07/28/2016   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

## 2021-05-25 NOTE — Patient Instructions (Addendum)
Access Code: [email or web address] URL: [email or web address] Date: 05/25/2021 Prepared by: Julio Alm  Exercises Supine Diaphragmatic Breathing - 1 x daily - 7 x weekly - 3 sets - 10 reps Cat Cow - 1 x daily - 7 x weekly - 2 sets - 10 reps Happy Baby with Pelvic Floor Lengthening - 1 x daily - 7 x weekly - 1 reps - 10 hold Half Kneeling Hip Flexor Stretch - 1 x daily - 7 x weekly - 3 sets - 10 reps  [facility] [address] [place], [place] [address] Phone # [phone] Fax [phone]

## 2021-06-02 ENCOUNTER — Ambulatory Visit: Payer: Medicaid Other

## 2021-06-02 ENCOUNTER — Other Ambulatory Visit: Payer: Self-pay

## 2021-06-02 DIAGNOSIS — M62838 Other muscle spasm: Secondary | ICD-10-CM

## 2021-06-02 DIAGNOSIS — M6281 Muscle weakness (generalized): Secondary | ICD-10-CM

## 2021-06-02 DIAGNOSIS — R279 Unspecified lack of coordination: Secondary | ICD-10-CM

## 2021-06-02 DIAGNOSIS — R102 Pelvic and perineal pain: Secondary | ICD-10-CM | POA: Diagnosis not present

## 2021-06-02 NOTE — Therapy (Signed)
[facility] [address] [place], [place], [address] Phone: [phone]   Fax:  [phone]  Physical Therapy Treatment  Patient Details  Name: Amy Jacobson MRN: [id number] Date of Birth: Oct 17, 1985 Referring Provider (PT): Elsie Lincoln MD   Encounter Date: 06/02/2021   PT End of Session - 06/02/21 0829     Visit Number 1    Date for PT Re-Evaluation 07/20/21    Authorization Type [place] Medicaid UHC Community    Authorization - Visit Number --    Authorization - Number of Visits 27    PT Start Time [phone]    PT Stop Time 0852    PT Time Calculation (min) 38 min    Activity Tolerance Patient tolerated treatment well    Behavior During Therapy [facility] for tasks assessed/performed             Past Medical History:  Diagnosis Date   Depression    Medical history non-contributory     Past Surgical History:  Procedure Laterality Date   NO PAST SURGERIES     WISDOM TOOTH EXTRACTION      There were no vitals filed for this visit.   Subjective Assessment - 06/02/21 0814     Subjective Pt states that she started menstrual cycle Monday and has had much more abdominal/hip pain since. She has had a very heavy cycle, more than normal. She has been working on exercises and drinking more water. She states that she has better overall awareness of when she is holding tensin pelvic floor and abdomen.    Patient Stated Goals Pt would like to decrease pain with intercourse, reduce LBP/abdominal pain, get stronger, and stop leaking.    Currently in Pain? Yes    Pain Score 4     Pain Location Back    Pain Orientation Right;Left    Pain Descriptors / Indicators Aching;Sharp    Pain Type Chronic pain    Pain Onset More than a month ago    Pain Frequency Intermittent    Aggravating Factors  Driving, menstrual cycles    Pain Relieving Factors Hot shower    Effect of Pain on Daily Activities somewhat limiting    Multiple Pain Sites  No                               [facility] Adult PT Treatment/Exercise - 06/02/21 0001       Neuro Re-ed    Neuro Re-ed Details  Diaphragmatic breathing in supine with VC/TCs - emphasis on connecting with/lengthening pelvic floor      Lumbar Exercises: Stretches   Lower Trunk Rotation 5 reps;10 seconds    Piriformis Stretch Right;Left;1 rep;60 seconds    Other Lumbar Stretch Exercise Child's pose, 2 x 60 sec   For pelvic floor and abdominal relaxation; VC/TCs for breathing   Other Lumbar Stretch Exercise Open books, 10x B      Lumbar Exercises: Sidelying   Clam Right;20 reps      Manual Therapy   Manual Therapy Soft tissue mobilization   Rt posterolateral hip muscles after dry needling             Trigger Point Dry Needling - 06/02/21 0001     Consent Given? Yes    Education Handout Provided Yes    Muscles Treated Back/Hip Gluteus minimus;Gluteus medius;Gluteus maximus;Piriformis;Tensor fascia lata    Gluteus Minimus Response Twitch response  elicited;Palpable increased muscle length    Gluteus Medius Response Twitch response elicited;Palpable increased muscle length    Gluteus Maximus Response Twitch response elicited;Palpable increased muscle length    Piriformis Response Twitch response elicited;Palpable increased muscle length    Tensor Fascia Lata Response Twitch response elicited;Palpable increased muscle length                   PT Education - 06/02/21 0828     Education Details Pt education performed on dry needling and educational handout provided; discussed importance of relaxation training and how pelvic floor tension is impacting tailbone pain and constipation.Access Code: [email or web address]    Person(s) Educated Patient    Methods Explanation;Demonstration;Tactile cues;Verbal cues;Handout    Comprehension Verbalized understanding              PT Short Term Goals - 05/25/21 1016       PT SHORT TERM GOAL #1   Title Pt will be  independent with HEP in 2 weeks.    Baseline No HEP    Time 2    Period Weeks    Status New    Target Date 06/08/21      PT SHORT TERM GOAL #2   Title Pt will improve all lumbar A/ROM by 25% in order to increase functional moiblity with all tasks in 4 weeks.    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    Baseline 5-8/10 LBP    Time 4    Period Weeks    Target Date 06/22/21               PT Long Term Goals - 05/25/21 1020       PT LONG TERM GOAL #1   Title Pt will demonstrate 4/5 pelvic floor muscle strength in order to improve urinary continence in all settings in 6 weeks.    Baseline 2/5 pelvic floor strength    Time 6    Period Weeks    Status New    Target Date 07/06/21      PT LONG TERM GOAL #2   Title Pt will be independent with intra-abdominal pressure management in strengthening program in 8 weeks to prevent worsening vaginal wall laxity.    Baseline No education yet    Time 8    Period Weeks    Status New    Target Date 07/20/21      PT LONG TERM GOAL #3   Title Pt will deny any pain with vaginal penetration in order to enjoy normal intimacy with partner in 8 weeks.    Baseline dyspareunia    Time 8    Period Weeks    Status New    Target Date 07/20/21                   Plan - 06/02/21 0856     Clinical Impression Statement Pt seeing progress with awareness of tension holding in abdomen and pelvis and working to relax; diaphragmatic breathing is improving and helping to increase this awareness. Intenral pelvic floor release not performed this session due to pt preference while on menstrual cycle; will plan to perform next session to help decrease dyspareunia and coccydynia, improving pelvic floor A/ROM. To  decrease palpable trigger points and pain in Rt. hip, dry  needling utilized with excellent response indicated by good tolerance, twitch, and lengthening of targeted muscles. Pt reported residual soreness, but improvement in pain and mobility. Good tolerance to all exercise progressions and down training demonstrated by no increase in pain. Pt will continue to benefit from skilled PT intervention in order to address impairments, improve dyspareunia, decrease LBP/LLQ pain, improve sitting/driving tolerance, and implement HEP that will allow pt to manage condition at home.    Examination-Activity Limitations Continence;Other    Examination-Participation Restrictions Community Activity;Interpersonal Relationship    PT Treatment/Interventions ADLs/Self Care Home Management;Biofeedback;Cryotherapy;Electrical Stimulation;Moist Heat;Neuromuscular re-education;Therapeutic exercise;Therapeutic activities;Patient/family education;Manual techniques;Dry needling;Passive range of motion;Spinal Manipulations    PT Next Visit Plan Plan to perform manual techniques to pelvic floor to improve tension and muscle length; progress down training; begin core facilitation training.    PT Home Exercise Plan Access Code: [email or web address]    Consulted and Agree with Plan of Care Patient             Patient will benefit from skilled therapeutic intervention in order to improve the following deficits and impairments:  Decreased mobility, Decreased strength, Decreased endurance, Decreased coordination, Decreased range of motion, Increased muscle spasms, Postural dysfunction, Impaired tone, Increased fascial restricitons, Decreased activity tolerance, Pain  Visit Diagnosis: Muscle weakness (generalized)  Other muscle spasm  Unspecified lack of coordination     Problem List Patient Active Problem List   Diagnosis Date Noted   Constipation 04/25/2021   PCB (post coital bleeding) 04/25/2021   Anxiety 11/14/2018   H/O rapid labor 04/25/2018   Abnormal TSH 07/28/2016   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

## 2021-06-02 NOTE — Patient Instructions (Addendum)
Trigger Point Dry Needling  What is Trigger Point Dry Needling (DN)? DN is a physical therapy technique used to treat muscle pain and dysfunction. Specifically, DN helps deactivate muscle trigger points (muscle knots).  A thin filiform needle is used to penetrate the skin and stimulate the underlying trigger point. The goal is for a local twitch response (LTR) to occur and for the trigger point to relax. No medication of any kind is injected during the procedure.   What Does Trigger Point Dry Needling Feel Like?  The procedure feels different for each individual patient. Some patients report that they do not actually feel the needle enter the skin and overall the process is not painful. Very mild bleeding may occur. However, many patients feel a deep cramping in the muscle in which the needle was inserted. This is the local twitch response.   How Will I feel after the treatment? Soreness is normal, and the onset of soreness may not occur for a few hours. Typically this soreness does not last longer than two days.  Bruising is uncommon, however; ice can be used to decrease any possible bruising.  In rare cases feeling tired or nauseous after the treatment is normal. In addition, your symptoms may get worse before they get better, this period will typically not last longer than 24 hours.   What Can I do After My Treatment? Increase your hydration by drinking more water for the next 24 hours. You may place ice or heat on the areas treated that have become sore, however, do not use heat on inflamed or bruised areas. Heat often brings more relief post needling. You can continue your regular activities, but vigorous activity is not recommended initially after the treatment for 24 hours. DN is best combined with other physical therapy such as strengthening, stretching, and other therapies.  [place] [address].  [phone]   Access Code:  [email or web address] URL: [email or web address] Date: 06/02/2021 Prepared by: Heather Roberts  Exercises Supine Diaphragmatic Breathing - 1 x daily - 7 x weekly - 3 sets - 10 reps Cat Cow - 1 x daily - 7 x weekly - 2 sets - 10 reps Happy Baby with Pelvic Floor Lengthening - 1 x daily - 7 x weekly - 1 reps - 10 hold Half Kneeling Hip Flexor Stretch - 1 x daily - 7 x weekly - 3 sets - 10 reps Clamshell - 1 x daily - 7 x weekly - 2 sets - 10 reps Supine Piriformis Stretch with Foot on Ground - 1 x daily - 7 x weekly - 1 sets - 3 reps - 60 hold Child's Pose Stretch - 1 x daily - 7 x weekly - 1 sets - 3 reps - 60 hold

## 2021-06-09 ENCOUNTER — Ambulatory Visit: Payer: Medicaid Other

## 2021-06-09 ENCOUNTER — Other Ambulatory Visit: Payer: Self-pay

## 2021-06-09 DIAGNOSIS — R102 Pelvic and perineal pain: Secondary | ICD-10-CM | POA: Diagnosis not present

## 2021-06-09 DIAGNOSIS — R279 Unspecified lack of coordination: Secondary | ICD-10-CM

## 2021-06-09 DIAGNOSIS — M62838 Other muscle spasm: Secondary | ICD-10-CM

## 2021-06-09 DIAGNOSIS — M6281 Muscle weakness (generalized): Secondary | ICD-10-CM

## 2021-06-09 NOTE — Therapy (Signed)
[facility] [address] [place], [place], [address] Phone: [phone]   Fax:  [phone]  Physical Therapy Treatment  Patient Details  Name: Amy Jacobson MRN: [id number] Date of Birth: 1986-04-04 Referring Provider (PT): Elsie LincolnKelly Leggett MD   Encounter Date: 06/09/2021   PT End of Session - 06/09/21 0808     Visit Number 3    Date for PT Re-Evaluation 07/20/21    Authorization Type  Medicaid UHC Community    Authorization - Number of Visits 27    PT Start Time 0800    PT Stop Time 0842    PT Time Calculation (min) 42 min    Activity Tolerance Patient tolerated treatment well    Behavior [facility] Therapy [facility] assessed/performed             Past Medical History:  Diagnosis Date   Depression    Medical history non-contributory     Past Surgical History:  Procedure Laterality Date   NO PAST SURGERIES     WISDOM TOOTH EXTRACTION      There were no vitals filed for this visit.   Subjective Assessment - 06/09/21 0800     Subjective Pt states that after last treatment she was very sore, but the next day she was fine. She states that she has not really felt hip pain since last treatment session.    Currently in Pain? No/denies    Multiple Pain Sites No                         No emotional/communication barriers or cognitive limitation. Patient is motivated to learn. Patient understands and agrees with treatment goals and plan. PT explains patient will be examined in standing, sitting, and lying down to see how their muscles and joints work. When they are ready, they will be asked to remove their underwear so PT can examine their perineum. The patient is also given the option of providing their own chaperone as one is not provided in our facility. The patient also has the right and is explained the right to defer or refuse any part of the evaluation or treatment including the internal exam.  With the patient's consent, PT will use one gloved finger to gently assess the muscles of the pelvic floor, seeing how well it contracts and relaxes and if there is muscle symmetry. After, the patient will get dressed and PT and patient will discuss exam findings and plan of care. PT and patient discuss plan of care, schedule, attendance policy and HEP activities.       [facility] Adult PT Treatment/Exercise - 06/09/21 0001       Neuro Re-ed    Neuro Re-ed Details  Diaphragmatic breathing in supine with VC/TCs - emphasis on connecting with/lengthening pelvic floor; pelvic floor contraction training with internal feedback and VC/TCs for breath coordination; transversus abdominus training with breath and VC/TCs      Lumbar Exercises: Supine   Bridge with Ball Squeeze 20 reps    Other Supine Lumbar Exercises Supine march with transversus contraction 2 x 10      Manual Therapy   Manual Therapy Internal Pelvic Floor    Internal Pelvic Floor Myofascial release with diaphragmatic breahting to superficial and deep pelvic floor                     PT Education - 06/09/21 0807     Education  Details Pt education performed on importance of lengthening pelvic floor before strengthening and purpose of manual techniques and relaxatoin training; we also discussed theimportanceof whole body strengthening in order to improve tension/pain throughout the body. [email or web address]    Person(s) Educated Patient    Methods Explanation;Tactile cues;Verbal cues;Demonstration;Handout              PT Short Term Goals - 05/25/21 1016       PT SHORT TERM GOAL #1   Title Pt will be independent with HEP in 2 weeks.    Baseline No HEP    Time 2    Period Weeks    Status New    Target Date 06/08/21      PT SHORT TERM GOAL #2   Title Pt will improve all lumbar A/ROM by 25% in order to increase functional moiblity with all tasks in 4 weeks.    Baseline Decreased lumbar A/ROM    Time 4    Period Weeks     Target Date 06/22/21      PT SHORT TERM GOAL #3   Title Pt will report no episode of LBP >2/10 in 4 weeks in order to perform driving functions for work.    Baseline 5-8/10 LBP    Time 4    Period Weeks    Target Date 06/22/21               PT Long Term Goals - 05/25/21 1020       PT LONG TERM GOAL #1   Title Pt will demonstrate 4/5 pelvic floor muscle strength in order to improve urinary continence in all settings in 6 weeks.    Baseline 2/5 pelvic floor strength    Time 6    Period Weeks    Status New    Target Date 07/06/21      PT LONG TERM GOAL #2   Title Pt will be independent with intra-abdominal pressure management in strengthening program in 8 weeks to prevent worsening vaginal wall laxity.    Baseline No education yet    Time 8    Period Weeks    Status New    Target Date 07/20/21      PT LONG TERM GOAL #3   Title Pt will deny any pain with vaginal penetration in order to enjoy normal intimacy with partner in 8 weeks.    Baseline dyspareunia    Time 8    Period Weeks    Status New    Target Date 07/20/21                   Plan - 06/09/21 3235     Clinical Impression Statement Pt demosntrated good improvements in pelvic floor tension with manual techniques to Bil superficial and deep layers; tension was greater on Rt compared to Lt, which is consistent with where more of her pain is. She continued to have diffiuclty with activation of pelvic floor, but working with breath coordination and VC/TCs she made excellent improvements and this was added to HEP. She did well with transversus abdominus training, but very weak; she was able to incorporate into supine march with good motor control over lumbar spine and pelvic. Less activation of Rt core/glutes compared to Lt, which could be responsible for imbalances causing Rt sided pain. She reported no pain at end of treatment session. She will continue to benefit from skilled PT intervention in order to  progress towards goals.    PT Treatment/Interventions ADLs/Self  Care Home Management;Biofeedback;Cryotherapy;Electrical Stimulation;Moist Heat;Neuromuscular re-education;Therapeutic exercise;Therapeutic activities;Patient/family education;Manual techniques;Dry needling;Passive range of motion;Spinal Manipulations    PT Next Visit Plan Possibe return to manual techniques for pelvic floor tension; if improved consider strengthening progressions; progress hip and core strengthening activities. Address B neck/shoulder discomfort if still bothering her.    PT Home Exercise Plan Access Code: [email or web address]    Consulted and Agree with Plan of Care Patient             Patient will benefit from skilled therapeutic intervention in order to improve the following deficits and impairments:  Decreased mobility, Decreased strength, Decreased endurance, Decreased coordination, Decreased range of motion, Increased muscle spasms, Postural dysfunction, Impaired tone, Increased fascial restricitons, Decreased activity tolerance, Pain  Visit Diagnosis: Muscle weakness (generalized)  Other muscle spasm  Unspecified lack of coordination     Problem List Patient Active Problem List   Diagnosis Date Noted   Constipation 04/25/2021   PCB (post coital bleeding) 04/25/2021   Anxiety 11/14/2018   H/O rapid labor 04/25/2018   Abnormal TSH 07/28/2016   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 [address] [place], [place], [address] Phone: [phone]   Fax:  [phone]  Name: SINAI ILLINGWORTH MRN: [id number] Date of Birth: [birth year]

## 2021-06-09 NOTE — Patient Instructions (Signed)
Access Code: [email or web address] URL: [email or web address] Date: 06/09/2021 Prepared by: Julio Alm  Exercises Supine Diaphragmatic Breathing - 1 x daily - 7 x weekly - 3 sets - 10 reps Cat Cow - 1 x daily - 7 x weekly - 2 sets - 10 reps Happy Baby with Pelvic Floor Lengthening - 1 x daily - 7 x weekly - 1 reps - 10 hold Half Kneeling Hip Flexor Stretch - 1 x daily - 7 x weekly - 3 sets - 10 reps Clamshell - 1 x daily - 7 x weekly - 2 sets - 10 reps Supine Piriformis Stretch with Foot on Ground - 1 x daily - 7 x weekly - 1 sets - 3 reps - 60 hold Child's Pose Stretch - 1 x daily - 7 x weekly - 1 sets - 3 reps - 60 hold Supine Pelvic Floor Contraction - 2 x daily - 7 x weekly - 2 sets - 10 reps  [facility] [address], [address] [place], [place] [address] Phone # [phone] Fax [phone]

## 2021-06-09 NOTE — Therapy (Signed)
[facility] [address] [place], [place], [address] Phone: [phone]   Fax:  [phone]  Physical Therapy Treatment  Patient Details  Name: Delton Seeshley M Holsomback MRN: [id number] Date of Birth: Aug 20, 1985 Referring Provider (PT): Elsie LincolnKelly Leggett MD   Encounter Date: 06/09/2021   PT End of Session - 06/09/21 0808     Visit Number 3    Date for PT Re-Evaluation 07/20/21    Authorization Type  Medicaid UHC Community    Authorization - Number of Visits 27    PT Start Time 0800    PT Stop Time 0842    PT Time Calculation (min) 42 min    Activity Tolerance Patient tolerated treatment well    Behavior During Therapy [facility] for tasks assessed/performed             Past Medical History:  Diagnosis Date   Depression    Medical history non-contributory     Past Surgical History:  Procedure Laterality Date   NO PAST SURGERIES     WISDOM TOOTH EXTRACTION      There were no vitals filed for this visit.   Subjective Assessment - 06/09/21 0800     Subjective Pt states that after last treatment she was very sore, but the next day she was fine. She states that she has not really felt hip pain since last treatment session.    Currently in Pain? No/denies    Multiple Pain Sites No                               [facility] Adult PT Treatment/Exercise - 06/09/21 0001       Neuro Re-ed    Neuro Re-ed Details  Diaphragmatic breathing in supine with VC/TCs - emphasis on connecting with/lengthening pelvic floor; pelvic floor contraction training with internal feedback and VC/TCs for breath coordination; transversus abdominus training with breath and VC/TCs      Lumbar Exercises: Supine   Bridge with Ball Squeeze 20 reps    Other Supine Lumbar Exercises Supine march with transversus contraction 2 x 10                     PT Education - 06/09/21 0807     Education Details Pt education performed on  importance of lengthening pelvic floor before strengthening and purpose of manual techniques and relaxatoin training; we also discussed theimportanceof whole body strengthening in order to improve tension/pain throughout the body. [email or web address]    Person(s) Educated Patient    Methods Explanation;Tactile cues;Verbal cues;Demonstration;Handout              PT Short Term Goals - 05/25/21 1016       PT SHORT TERM GOAL #1   Title Pt will be independent with HEP in 2 weeks.    Baseline No HEP    Time 2    Period Weeks    Status New    Target Date 06/08/21      PT SHORT TERM GOAL #2   Title Pt will improve all lumbar A/ROM by 25% in order to increase functional moiblity with all tasks in 4 weeks.    Baseline Decreased lumbar A/ROM    Time 4    Period Weeks    Target Date 06/22/21      PT SHORT TERM GOAL #3   Title Pt will report no episode of LBP >2/10 in 4  weeks in order to perform driving functions for work.    Baseline 5-8/10 LBP    Time 4    Period Weeks    Target Date 06/22/21               PT Long Term Goals - 05/25/21 1020       PT LONG TERM GOAL #1   Title Pt will demonstrate 4/5 pelvic floor muscle strength in order to improve urinary continence in all settings in 6 weeks.    Baseline 2/5 pelvic floor strength    Time 6    Period Weeks    Status New    Target Date 07/06/21      PT LONG TERM GOAL #2   Title Pt will be independent with intra-abdominal pressure management in strengthening program in 8 weeks to prevent worsening vaginal wall laxity.    Baseline No education yet    Time 8    Period Weeks    Status New    Target Date 07/20/21      PT LONG TERM GOAL #3   Title Pt will deny any pain with vaginal penetration in order to enjoy normal intimacy with partner in 8 weeks.    Baseline dyspareunia    Time 8    Period Weeks    Status New    Target Date 07/20/21                   Plan - 06/09/21 4010     Clinical Impression Statement Pt  demosntrated good improvements in pelvic floor tension with manual techniques to Bil superficial and deep layers; tension was greater on Rt compared to Lt, which is consistent with where more of her pain is. She continued to have diffiuclty with activation of pelvic floor, but working with breath coordination and VC/TCs she made excellent improvements and this was added to HEP. She did well with transversus abdominus training, but very weak; she was able to incorporate into supine march with good motor control over lumbar spine and pelvic. Less activation of Rt core/glutes compared to Lt, which could be responsible for imbalances causing Rt sided pain. She reported no pain at end of treatment session. She will continue to benefit from skilled PT intervention in order to progress towards goals.    PT Treatment/Interventions ADLs/Self Care Home Management;Biofeedback;Cryotherapy;Electrical Stimulation;Moist Heat;Neuromuscular re-education;Therapeutic exercise;Therapeutic activities;Patient/family education;Manual techniques;Dry needling;Passive range of motion;Spinal Manipulations    PT Next Visit Plan Possibe return to manual techniques for pelvic floor tension; if improved consider strengthening progressions; progress hip and core strengthening activities. Address B neck/shoulder discomfort if still bothering her.    PT Home Exercise Plan Access Code: [email or web address]    Consulted and Agree with Plan of Care Patient             Patient will benefit from skilled therapeutic intervention in order to improve the following deficits and impairments:  Decreased mobility, Decreased strength, Decreased endurance, Decreased coordination, Decreased range of motion, Increased muscle spasms, Postural dysfunction, Impaired tone, Increased fascial restricitons, Decreased activity tolerance, Pain  Visit Diagnosis: Muscle weakness (generalized)  Other muscle spasm  Unspecified lack of coordination     Problem  List Patient Active Problem List   Diagnosis Date Noted   Constipation 04/25/2021   PCB (post coital bleeding) 04/25/2021   Anxiety 11/14/2018   H/O rapid labor 04/25/2018   Abnormal TSH 07/28/2016   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 [place], [place], [address] Phone: [phone]   Fax:  [phone]  Name: PAITYN BALSAM MRN: [id number] Date of Birth: November 09, 1985

## 2021-06-16 ENCOUNTER — Ambulatory Visit: Payer: Medicaid Other | Attending: Obstetrics & Gynecology

## 2021-06-16 ENCOUNTER — Other Ambulatory Visit: Payer: Self-pay

## 2021-06-16 DIAGNOSIS — R279 Unspecified lack of coordination: Secondary | ICD-10-CM | POA: Diagnosis present

## 2021-06-16 DIAGNOSIS — M62838 Other muscle spasm: Secondary | ICD-10-CM | POA: Diagnosis present

## 2021-06-16 DIAGNOSIS — M6281 Muscle weakness (generalized): Secondary | ICD-10-CM | POA: Diagnosis not present

## 2021-06-16 NOTE — Patient Instructions (Signed)
Access Code: [email or web address] URL: [email or web address] Date: 06/16/2021 Prepared by: Julio Alm  Exercises Supine Diaphragmatic Breathing - 1 x daily - 7 x weekly - 3 sets - 10 reps Cat Cow - 1 x daily - 7 x weekly - 2 sets - 10 reps Happy Baby with Pelvic Floor Lengthening - 1 x daily - 7 x weekly - 1 reps - 10 hold Half Kneeling Hip Flexor Stretch - 1 x daily - 7 x weekly - 3 sets - 10 reps Clamshell - 1 x daily - 7 x weekly - 2 sets - 10 reps Supine Piriformis Stretch with Foot on Ground - 1 x daily - 7 x weekly - 1 sets - 3 reps - 60 hold Child's Pose Stretch - 1 x daily - 7 x weekly - 1 sets - 3 reps - 60 hold Supine Pelvic Floor Contraction - 2 x daily - 7 x weekly - 2 sets - 10 reps Supine Bridge with Mini Swiss Ball Between Knees - 1 x daily - 7 x weekly - 2 sets - 10 reps Quadruped Fire Hydrant - 1 x daily - 7 x weekly - 2 sets - 10 reps Quadruped Hip Extension with Mini Swiss Ball - 1 x daily - 7 x weekly - 2 sets - 10 reps

## 2021-06-16 NOTE — Therapy (Signed)
[facility] [address] [place], [place], [address] Phone: [phone]   Fax:  [phone]  Physical Therapy Treatment  Patient Details  Name: Amy Jacobson MRN: [id number] Date of Birth: 1986-03-30 Referring Provider (PT): Elsie LincolnKelly Leggett MD   Encounter Date: 06/16/2021   PT End of Session - 06/16/21 0808     Visit Number 4    Number of Visits 8    Date for PT Re-Evaluation 07/20/21    Authorization Type [place] Medicaid UHC Community    Authorization - Number of Visits 27    PT Start Time 0805    PT Stop Time 0843    PT Time Calculation (min) 38 min    Activity Tolerance Patient tolerated treatment well    Behavior During Therapy [facility] for tasks assessed/performed             Past Medical History:  Diagnosis Date   Depression    Medical history non-contributory     Past Surgical History:  Procedure Laterality Date   NO PAST SURGERIES     WISDOM TOOTH EXTRACTION      There were no vitals filed for this visit.   Subjective Assessment - 06/16/21 0806     Subjective Pt states that she is very tired and sore due to having a very busy weekend. She had intercourse over the weekend and woke up with tailbone pain afterwards. She denies pain currently, but while driving here she started having tailbone pain that she would rate at 1/10. She is not having any dyspareunia during intercourse and states that she has not had any Lt lower quadrant pain.    Patient Stated Goals Pt would like to decrease pain with intercourse, reduce LBP/abdominal pain, get stronger, and stop leaking.    Currently in Pain? No/denies    Multiple Pain Sites No                    No emotional/communication barriers or cognitive limitation. Patient is motivated to learn. Patient understands and agrees with treatment goals and plan. PT explains patient will be examined in standing, sitting, and lying down to see how their muscles and joints  work. When they are ready, they will be asked to remove their underwear so PT can examine their perineum. The patient is also given the option of providing their own chaperone as one is not provided in our facility. The patient also has the right and is explained the right to defer or refuse any part of the evaluation or treatment including the internal exam. With the patient's consent, PT will use one gloved finger to gently assess the muscles of the pelvic floor, seeing how well it contracts and relaxes and if there is muscle symmetry. After, the patient will get dressed and PT and patient will discuss exam findings and plan of care. PT and patient discuss plan of care, schedule, attendance policy and HEP activities.            [facility] - 06/16/21 0001       Lumbar Exercises: Supine   Bridge with Newman PiesBall Squeeze 20 reps      Lumbar Exercises: Quadruped   Madcat/Old Horse 20 reps    Other Quadruped Lumbar Exercises Fire hydrant 10x Bil    Other Quadruped Lumbar Exercises Donkey kick 10x bil      Manual Therapy   Manual Therapy Joint mobilization    Joint Mobilization Coccyx mobilizations  and traction    Internal Pelvic Floor Myofascial release through rectal technique to posterior levator ani and coccygeal attachments bil                     PT Education - 06/16/21 0825     Education Details Pt education perofrmed on rectal manual techniques to have best access to coccyx and reducing coccydynia/posterior pelvic floor tension. HEP updated and she was encouraged to continue working on strengthening. [email or web address]    Person(s) Educated Patient    Methods Explanation;Demonstration;Tactile cues;Verbal cues;Handout    Comprehension Verbalized understanding              PT Short Term Goals - 05/25/21 1016       PT SHORT TERM GOAL #1   Title Pt will be independent with HEP in 2 weeks.    Baseline No HEP    Time 2    Period Weeks    Status New     Target Date 06/08/21      PT SHORT TERM GOAL #2   Title Pt will improve all lumbar A/ROM by 25% in order to increase functional moiblity with all tasks in 4 weeks.    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    Baseline 5-8/10 LBP    Time 4    Period Weeks    Target Date 06/22/21               PT Long Term Goals - 05/25/21 1020       PT LONG TERM GOAL #1   Title Pt will demonstrate 4/5 pelvic floor muscle strength in order to improve urinary continence in all settings in 6 weeks.    Baseline 2/5 pelvic floor strength    Time 6    Period Weeks    Status New    Target Date 07/06/21      PT LONG TERM GOAL #2   Title Pt will be independent with intra-abdominal pressure management in strengthening program in 8 weeks to prevent worsening vaginal wall laxity.    Baseline No education yet    Time 8    Period Weeks    Status New    Target Date 07/20/21      PT LONG TERM GOAL #3   Title Pt will deny any pain with vaginal penetration in order to enjoy normal intimacy with partner in 8 weeks.    Baseline dyspareunia    Time 8    Period Weeks    Status New    Target Date 07/20/21                   Plan - 06/16/21 6160     Clinical Impression Statement Due to persistent pain with intercourse that is resulting in coccydynia, intenral rectal manual techniques perofrmed today with pt consent. We were able to access coccyx, perform mobilizations/traction, and pelvic floor release along coccygeal borders with good improvement in mobility and myofascial restriction. She did very well with strengthening progressions in quadruped to work on hip muscle strengthening and core motor control; she reported no increase in pain and denied any coccydynia at end of session. She will continue to benefit from  skilled PT  intervention in order to work towards goal completion and improve QOL.    PT Treatment/Interventions ADLs/Self Care Home Management;Biofeedback;Cryotherapy;Electrical Stimulation;Moist Heat;Neuromuscular re-education;Therapeutic exercise;Therapeutic activities;Patient/family education;Manual techniques;Dry needling;Passive range of motion;Spinal Manipulations    PT Next Visit Plan Possibe return to manual techniques for pelvic floor tension; if improved consider strengthening progressions; progress hip and core strengthening activities. Address B neck/shoulder discomfort if still bothering her.    PT Home Exercise Plan Access Code: [email or web address]    Consulted and Agree with Plan of Care Patient             Patient will benefit from skilled therapeutic intervention in order to improve the following deficits and impairments:  Decreased mobility, Decreased strength, Decreased endurance, Decreased coordination, Decreased range of motion, Increased muscle spasms, Postural dysfunction, Impaired tone, Increased fascial restricitons, Decreased activity tolerance, Pain  Visit Diagnosis: Muscle weakness (generalized)  Other muscle spasm  Unspecified lack of coordination     Problem List Patient Active Problem List   Diagnosis Date Noted   Constipation 04/25/2021   PCB (post coital bleeding) 04/25/2021   Anxiety 11/14/2018   H/O rapid labor 04/25/2018   Abnormal TSH 07/28/2016   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 [place], [place], [address] Phone: [phone]   Fax:  [phone]  Name: NASHA DISS MRN: [id number] Date of Birth: 30-Aug-1985

## 2021-06-23 ENCOUNTER — Other Ambulatory Visit: Payer: Self-pay

## 2021-06-23 ENCOUNTER — Ambulatory Visit: Payer: Medicaid Other

## 2021-06-23 DIAGNOSIS — M62838 Other muscle spasm: Secondary | ICD-10-CM

## 2021-06-23 DIAGNOSIS — R279 Unspecified lack of coordination: Secondary | ICD-10-CM

## 2021-06-23 DIAGNOSIS — M6281 Muscle weakness (generalized): Secondary | ICD-10-CM

## 2021-06-23 NOTE — Patient Instructions (Signed)
Access Code: [email or web address] URL: [email or web address] Date: 06/23/2021 Prepared by: Heather Roberts  Exercises Cat Cow - 1 x daily - 7 x weekly - 2 sets - 10 reps Happy Baby with Pelvic Floor Lengthening - 1 x daily - 7 x weekly - 1 reps - 10 hold Half Kneeling Hip Flexor Stretch - 1 x daily - 7 x weekly - 3 sets - 10 reps Clamshell - 1 x daily - 7 x weekly - 2 sets - 10 reps Supine Piriformis Stretch with Foot on Ground - 1 x daily - 7 x weekly - 1 sets - 3 reps - 60 hold Child's Pose Stretch - 1 x daily - 7 x weekly - 1 sets - 3 reps - 60 hold Supine Pelvic Floor Contraction - 2 x daily - 7 x weekly - 2 sets - 10 reps Quadruped Fire Hydrant - 1 x daily - 7 x weekly - 2 sets - 10 reps Quadruped Hip Extension with Mini Swiss Ball - 1 x daily - 7 x weekly - 2 sets - 10 reps Sidelying Open Book Thoracic Lumbar Rotation and Extension - 1 x daily - 7 x weekly - 1 sets - 10 reps Supine Dead Bug with Leg Extension - 1 x daily - 7 x weekly - 2 sets - 10 reps Squat - 1 x daily - 7 x weekly - 2 sets - 10 reps Shoulder extension with resistance - Neutral - 1 x daily - 7 x weekly - 2 sets - 10 reps

## 2021-06-23 NOTE — Therapy (Signed)
[facility] @ [address] [place], [place], [address] Phone: [phone]   Fax:  [phone]  Physical Therapy Treatment  Patient Details  Name: Amy Jacobson MRN: [id number] Date of Birth: 1985/06/17 Referring Provider (PT): Silas Sacramento MD   Encounter Date: 06/23/2021   PT End of Session - 06/23/21 0841     Visit Number 5    Number of Visits 8    Date for PT Re-Evaluation 07/20/21    Authorization Type [place] Medicaid UHC Community    PT Start Time 0801    PT Stop Time 0848    PT Time Calculation (min) 47 min    Activity Tolerance Patient tolerated treatment well    Behavior During Therapy [facility] for tasks assessed/performed             Past Medical History:  Diagnosis Date   Depression    Medical history non-contributory     Past Surgical History:  Procedure Laterality Date   NO PAST SURGERIES     WISDOM TOOTH EXTRACTION      There were no vitals filed for this visit.   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 She believes that maual techniques to tailbone were helpful and she had much less tailbone pain over the last week. She is having mild Rt sided LBP/hip pain.    Patient Stated Goals Pt would like to decrease pain with intercourse, reduce LBP/abdominal pain, get stronger, and stop leaking.    Currently in Pain? Yes    Pain Score 2     Pain Location Hip    Pain Orientation Right    Pain Descriptors / Indicators Aching    Pain Type Chronic pain    Pain Onset More than a month ago    Pain Frequency Intermittent    Aggravating Factors  Driving, menstrual cycles    Pain Relieving Factors Hot shower    Effect of Pain on Daily Activities some-what limiting    Multiple Pain Sites No                               [facility] Adult PT Treatment/Exercise - 06/23/21 0001       Neuro  Re-ed    Neuro Re-ed Details  Core facilitation training with supine march      Lumbar Exercises: Standing   Functional Squats 10 reps   breath coordination   Shoulder Extension Strengthening;20 reps;Theraband   red     Lumbar Exercises: Supine   Dead Bug 20 reps   multimodal cues to encourage pelvic control/core activation with breathing; modified ROM for appropriate form   Other Supine Lumbar Exercises Leg extensions with appropriate core activation and pelvic control 10x Bil      Lumbar Exercises: Sidelying   Other Sidelying Lumbar Exercises Open books 10x Bil              Trigger Point Dry Needling - 06/23/21 0001     Consent Given? Yes    Education Handout Provided Previously provided    Muscles Treated Back/Hip Gluteus medius;Piriformis;Tensor fascia lata    Gluteus Medius Response Twitch response elicited;Palpable increased muscle length    Piriformis Response Twitch response elicited;Palpable increased muscle length    Tensor Fascia Lata Response Twitch response elicited;Palpable increased muscle length  PT Education - 06/23/21 0830     Education Details Pt education performed on HEP progressions and handout updated. [id number]    Person(s) Educated Patient    Methods Explanation;Demonstration;Tactile cues;Verbal cues;Handout    Comprehension Verbalized understanding              PT Short Term Goals - 05/25/21 1016       PT SHORT TERM GOAL #1   Title Pt will be independent with HEP in 2 weeks.    Baseline No HEP    Time 2    Period Weeks    Status New    Target Date 06/08/21      PT SHORT TERM GOAL #2   Title Pt will improve all lumbar A/ROM by 25% in order to increase functional moiblity with all tasks in 4 weeks.    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    Baseline 5-8/10 LBP    Time 4    Period Weeks    Target Date 06/22/21               PT Long Term Goals - 05/25/21 1020       PT LONG TERM GOAL #1   Title Pt will demonstrate 4/5 pelvic floor muscle strength in order to improve urinary continence in all settings in 6 weeks.    Baseline 2/5 pelvic floor strength    Time 6    Period Weeks    Status New    Target Date 07/06/21      PT LONG TERM GOAL #2   Title Pt will be independent with intra-abdominal pressure management in strengthening program in 8 weeks to prevent worsening vaginal wall laxity.    Baseline No education yet    Time 8    Period Weeks    Status New    Target Date 07/20/21      PT LONG TERM GOAL #3   Title Pt will deny any pain with vaginal penetration in order to enjoy normal intimacy with partner in 8 weeks.    Baseline dyspareunia    Time 8    Period Weeks    Status New    Target Date 07/20/21                   Plan - 06/23/21 0819     Clinical Impression Statement Due to continued Rt hip pain over the last 2 visits, DN performed to Rt posterolateral hip muscles again today with excellent improvement in muscle length, trigger points, and disocmfort; for improvement in soft tissue restriction with soft tissue mobilization following DN. Pt tolerated all previous exercises and new progressions for core and hip strengthening well demonstrated by no increase in pain and appropriate challenge; she did require multimodal cues for appropriate transversus abdominus activation with all exercises. She will continue to benefit from skilled PT intervention in order to progress functional strengthening and work towards goal completion.    PT Treatment/Interventions ADLs/Self Care Home Management;Biofeedback;Cryotherapy;Electrical Stimulation;Moist Heat;Neuromuscular re-education;Therapeutic exercise;Therapeutic activities;Patient/family education;Manual techniques;Dry needling;Passive range of  motion;Spinal Manipulations    PT Next Visit Plan Possibe return to manual techniques for pelvic floor tension; if improved consider strengthening progressions; progress hip and core strengthening activities -  side stepping, 3-way kick, dead bug with ball, bent row, weighted squat with anterior hold.    PT Home Exercise Plan Access Code: [email or web address] and Agree with Plan of Care Patient             Patient will benefit from skilled therapeutic intervention in order to improve the following deficits and impairments:  Decreased mobility, Decreased strength, Decreased endurance, Decreased coordination, Decreased range of motion, Increased muscle spasms, Postural dysfunction, Impaired tone, Increased fascial restricitons, Decreased activity tolerance, Pain  Visit Diagnosis: Muscle weakness (generalized)  Other muscle spasm  Unspecified lack of coordination     Problem List Patient Active Problem List   Diagnosis Date Noted   Constipation 04/25/2021   PCB (post coital bleeding) 04/25/2021   Anxiety 11/14/2018   H/O rapid labor 04/25/2018   Abnormal TSH 07/28/2016   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 @ [facility] [employer] [employer] [place], [place], [address] Phone: [phone]   Fax:  [phone]  Name: Amy Jacobson MRN: [id number] Date of Birth: Dec 29, 1985

## 2021-06-30 ENCOUNTER — Ambulatory Visit: Payer: Medicaid Other

## 2021-06-30 ENCOUNTER — Other Ambulatory Visit: Payer: Self-pay

## 2021-06-30 DIAGNOSIS — M6281 Muscle weakness (generalized): Secondary | ICD-10-CM | POA: Diagnosis not present

## 2021-06-30 DIAGNOSIS — M62838 Other muscle spasm: Secondary | ICD-10-CM

## 2021-06-30 DIAGNOSIS — R279 Unspecified lack of coordination: Secondary | ICD-10-CM

## 2021-06-30 NOTE — Therapy (Signed)
[facility] @ [facility] [address] [place], [place], [address] Phone: [phone]   Fax:  [phone]  Physical Therapy Treatment  Patient Details  Name: Amy Jacobson MRN: [id number] Date of Birth: 1985-09-30 Referring Provider (PT): Elsie Lincoln MD   Encounter Date: 06/30/2021   PT End of Session - 06/30/21 0756     Visit Number 6    Number of Visits 7    Date for PT Re-Evaluation 07/20/21    Authorization Type [place] Medicaid UHC Community    PT Start Time 0800    PT Stop Time [phone]    PT Time Calculation (min) 42 min    Activity Tolerance Patient tolerated treatment well    Behavior During Therapy [facility] for tasks assessed/performed             Past Medical History:  Diagnosis Date   Depression    Medical history non-contributory     Past Surgical History:  Procedure Laterality Date   NO PAST SURGERIES     WISDOM TOOTH EXTRACTION      There were no vitals filed for this visit.   Subjective Assessment - 06/30/21 0800     Subjective Pt states that her Rt groin has started hurting more again. She is not having any pain with intercourse or tailbone pain.    Patient Stated Goals Pt would like to decrease pain with intercourse, reduce LBP/abdominal pain, get stronger, and stop leaking.    Pain Score 1     Pain Location Hip    Pain Orientation Right    Pain Descriptors / Indicators Aching    Pain Type Chronic pain    Pain Onset More than a month ago    Pain Frequency Intermittent    Aggravating Factors  lying on Rt side    Pain Relieving Factors heat    Effect of Pain on Daily Activities not limiting    Multiple Pain Sites No                               [facility] Adult PT Treatment/Exercise - 06/30/21 0001       Lumbar Exercises: Supine   Dead Bug 20 reps   multimodal cues to encourage pelvic control/core activation with breathing; modified ROM for appropriate form   Bridge with March 20 reps       Knee/Hip Exercises: Standing   Other Standing Knee Exercises hip 3-way 10x each direction, bil; sidestepping red band 3 laps of 10 steps      Knee/Hip Exercises: Supine   Straight Leg Raises Strengthening;2 sets;10 reps      Knee/Hip Exercises: Sidelying   Hip ABduction Strengthening;2 sets;Both;10 reps      Manual Therapy   Manual Therapy Soft tissue mobilization    Soft tissue mobilization Rt TFL and adductors/hip flexors              Trigger Point Dry Needling - 06/30/21 0001     Consent Given? Yes    Education Handout Provided Previously provided    Muscles Treated Lower Quadrant Adductor longus/brevis/magnus    Muscles Treated Back/Hip Tensor fascia lata    Adductor Response Twitch response elicited;Palpable increased muscle length    Tensor Fascia Lata Response Twitch response elicited;Palpable increased muscle length                   PT Education - 06/30/21 0820  Education Details Pt education performed on D/C planning due to good progress and possible D/C next visit if there are no changes - pt agreeable to plan. Education performed on all new exercises. [id number]    Person(s) Educated Patient    Methods Explanation;Demonstration;Tactile cues;Verbal cues;Handout    Comprehension Verbalized understanding              PT Short Term Goals - 05/25/21 1016       PT SHORT TERM GOAL #1   Title Pt will be independent with HEP in 2 weeks.    Baseline No HEP    Time 2    Period Weeks    Status New    Target Date 06/08/21      PT SHORT TERM GOAL #2   Title Pt will improve all lumbar A/ROM by 25% in order to increase functional moiblity with all tasks in 4 weeks.    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    Baseline 5-8/10 LBP    Time 4    Period Weeks    Target  Date 06/22/21               PT Long Term Goals - 05/25/21 1020       PT LONG TERM GOAL #1   Title Pt will demonstrate 4/5 pelvic floor muscle strength in order to improve urinary continence in all settings in 6 weeks.    Baseline 2/5 pelvic floor strength    Time 6    Period Weeks    Status New    Target Date 07/06/21      PT LONG TERM GOAL #2   Title Pt will be independent with intra-abdominal pressure management in strengthening program in 8 weeks to prevent worsening vaginal wall laxity.    Baseline No education yet    Time 8    Period Weeks    Status New    Target Date 07/20/21      PT LONG TERM GOAL #3   Title Pt will deny any pain with vaginal penetration in order to enjoy normal intimacy with partner in 8 weeks.    Baseline dyspareunia    Time 8    Period Weeks    Status New    Target Date 07/20/21                   Plan - 06/30/21 8315     Clinical Impression Statement Pt overall making great progress with no pain reported with intercourse, no tialbone pain, and no posterior hip pain. She did have minor increase in Rt anterior hip, but believe this is due to core weakness and will continue to improve as she makes strengthening improvements. She did tolerated DN with significant twitch response and good relief of pain; she did report residual soreness. Exercises progressed to include core and hip strengthening to improve pelvic stability, reducing future pelvic floor tension and gripping of surrounding muscles. She will continue to benefit from skilled PT intervention in order to progress exercises, go over pelvic floor wand use, and possible D/C.    PT Treatment/Interventions ADLs/Self Care Home Management;Biofeedback;Cryotherapy;Electrical Stimulation;Moist Heat;Neuromuscular re-education;Therapeutic exercise;Therapeutic activities;Patient/family education;Manual techniques;Dry needling;Passive range of motion;Spinal Manipulations    PT  Next Visit Plan Plan  to go over pelvic wand use for when she has flare-ups in pain; progress exercises to include bent row, weighted squat with anterior hold, hip 3-way; plan to D/C.    PT Home Exercise Plan [email or web address]    Consulted and Agree with Plan of Care Patient             Patient will benefit from skilled therapeutic intervention in order to improve the following deficits and impairments:  Decreased mobility, Decreased strength, Decreased endurance, Decreased coordination, Decreased range of motion, Increased muscle spasms, Postural dysfunction, Impaired tone, Increased fascial restricitons, Decreased activity tolerance, Pain  Visit Diagnosis: Muscle weakness (generalized)  Other muscle spasm  Unspecified lack of coordination     Problem List Patient Active Problem List   Diagnosis Date Noted   Constipation 04/25/2021   PCB (post coital bleeding) 04/25/2021   Anxiety 11/14/2018   H/O rapid labor 04/25/2018   Abnormal TSH 07/28/2016   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

## 2021-07-07 ENCOUNTER — Ambulatory Visit: Payer: Medicaid Other

## 2021-07-14 ENCOUNTER — Other Ambulatory Visit: Payer: Self-pay

## 2021-07-14 ENCOUNTER — Ambulatory Visit: Payer: Medicaid Other | Attending: Obstetrics & Gynecology

## 2021-07-14 DIAGNOSIS — R279 Unspecified lack of coordination: Secondary | ICD-10-CM | POA: Diagnosis present

## 2021-07-14 DIAGNOSIS — M62838 Other muscle spasm: Secondary | ICD-10-CM | POA: Insufficient documentation

## 2021-07-14 DIAGNOSIS — M6281 Muscle weakness (generalized): Secondary | ICD-10-CM | POA: Insufficient documentation

## 2021-07-14 NOTE — Therapy (Signed)
[facility] ?[facility] @ [facility] ?[place], [place], [address] ?Phone: [phone]   Fax:  [phone] ? ?Physical Therapy Treatment ? ?Patient Details  ?Name: LYNDA WANNINGER ?MRN: [id number] ?Date of Birth: [birth year] ?Referring Provider (PT): Silas Sacramento MD ? ? ?Encounter Date: 07/14/2021 ? ? PT End of Session - 07/14/21 0802   ? ? Visit Number 7   ? Authorization Type [place] Medicaid UHC Community   ? PT Start Time 0800   ? PT Stop Time [phone]   ? PT Time Calculation (min) 38 min   ? Activity Tolerance Patient tolerated treatment well   ? Behavior During Therapy [facility] for tasks assessed/performed   ? ?  ?  ? ?  ? ? ?Past Medical History:  ?Diagnosis Date  ? Depression   ? Medical history non-contributory   ? ? ?Past Surgical History:  ?Procedure Laterality Date  ? NO PAST SURGERIES    ? WISDOM TOOTH EXTRACTION    ? ? ?There were no vitals filed for this visit. ? ? Subjective Assessment - 07/14/21 0802   ? ? Subjective Pt states that she is not having any pain with intercourse. She states that she has not been having any tailbone pain or low back pain.   ? Currently in Pain? No/denies   ? Multiple Pain Sites No   ? ?  ?  ? ?  ? ? ? ? ? [facility] PT Assessment - 07/14/21 0001   ? ?  ? Assessment  ? Medical Diagnosis R10.2 Pelvic pain   ? Referring Provider (PT) Silas Sacramento MD   ?  ? AROM  ? Lumbar Flexion 100% no discomfort   ? Lumbar Extension 75% no discomfort   ? Lumbar - Right Side Bend 100% no disocmfort   ? Lumbar - Left Side Bend 100% no discomfort   ? Lumbar - Right Rotation 100% no discomfort   ? Lumbar - Left Rotation 100% no discomfort   ? ?  ?  ? ?  ? ? ? ? ? ?No emotional/communication barriers or cognitive limitation. Patient is motivated to learn. Patient understands and agrees with treatment goals and plan. PT explains patient will be examined in standing, sitting, and lying down to see how their muscles and joints work. When they are ready, they will be asked to  remove their underwear so PT can examine their perineum. The patient is also given the option of providing their own chaperone as one is not provided in our facility. The patient also has the right and is explained the right to defer or refuse any part of the evaluation or treatment including the internal exam. With the patient's consent, PT will use one gloved finger to gently assess the muscles of the pelvic floor, seeing how well it contracts and relaxes and if there is muscle symmetry. After, the patient will get dressed and PT and patient will discuss exam findings and plan of care. PT and patient discuss plan of care, schedule, attendance policy and HEP activities. ? ? ? ? ? ? ? ? Pelvic Floor Special Questions - 07/14/21 0001   ? ? External Palpation No discomfort   ? Pelvic Floor Internal Exam Pt identity confirmed and verbal consent provided by pt   ? Exam Type Vaginal   ? Sensation WNL   ? Palpation no discomfort   ? Strength good squeeze, good lift, able to hold agaisnt strong resistance   ? Strength # of  reps 8   ? Strength # of seconds 10   ? Tone WNL   ? ?  ?  ? ?  ? ? ? ? [facility] Adult PT Treatment/Exercise - 07/14/21 0001   ? ?  ? Self-Care  ? Self-Care Other Self-Care Comments   Prelvic floor wand education; HEP continuation/expectation education  ?  ? Neuro Re-ed   ? Neuro Re-ed Details  Pelvic floor contraction training and discussion about not performing contractions as partof strengthening program   ? ?  ?  ? ?  ? ? ? ? ? ? ? ? ? ? PT Education - 07/14/21 0839   ? ? Education Details Pt education performed on pelvic floor muscle wand use, HEP moving forward, and progress with all exam findings found upon re-evaluation today.   ? Person(s) Educated Patient   ? Methods Explanation;Demonstration;Tactile cues;Verbal cues   ? Comprehension Verbalized understanding   ? ?  ?  ? ?  ? ? ? PT Short Term Goals - 07/14/21 0805   ? ?  ? PT SHORT TERM GOAL #1  ? Title Pt will be independent with HEP in 2 weeks.    ? Baseline No HEP   ? Time 2   ? Period Weeks   ? Status Achieved   ? Target Date 06/08/21   ?  ? PT SHORT TERM GOAL #2  ? Title Pt will improve all lumbar A/ROM by 25% in order to increase functional moiblity with all tasks in 4 weeks.   ? Baseline Decreased lumbar A/ROM   ? Time 4   ? Period Weeks   ? Status Achieved   ? Target Date 06/22/21   ?  ? PT SHORT TERM GOAL #3  ? 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   ? Baseline 5-8/10 LBP   ? Time 4   ? Period Weeks   ? Status Achieved   ? Target Date 06/22/21   ? ?  ?  ? ?  ? ? ? ? PT Long Term Goals - 07/14/21 0805   ? ?  ? PT LONG TERM GOAL #1  ? Title Pt will demonstrate 4/5 pelvic floor muscle strength in order to improve urinary continence in all settings in 6 weeks.   ? Baseline 2/5 pelvic floor strength   ? Time 6   ? Period Weeks   ? Status Achieved   ? Target Date 07/06/21   ?  ? PT LONG TERM GOAL #2  ? Title Pt will be independent with intra-abdominal pressure management in strengthening program in 8 weeks to prevent worsening vaginal wall laxity.   ? Baseline No education yet   ? Time 8   ? Period Weeks   ? Status Achieved   ? Target Date 07/20/21   ?  ? PT LONG TERM GOAL #3  ? Title Pt will deny any pain with vaginal penetration in order to enjoy normal intimacy with partner in 8 weeks.   ? Baseline dyspareunia   ? Time 8   ? Period Weeks   ? Status Achieved   ? Target Date 07/20/21   ? ?  ?  ? ?  ? ? ? ? ? ? ? ? Plan - 07/14/21 0840   ? ? Clinical Impression Statement Pt here for re-evaluation of pelvic, low back, and abdominal pain. She has goneseveral weeks without having any pain in  any of these areas and independent perofrmance of HEP. Exam findings are notable for all lumbar A/ROM being WNL, normalized pelvic floor tone, no tenderness throughout pelvic floor muscles, and increase in pelvic floro strength to 4/5. Due to progress and having met all goals, she is prepared to D/C skilled  PT intervention at this time; she was encouraged to call with any questions/concerns.   ? Stability/Clinical Decision Making Stable/Uncomplicated   ? Clinical Decision Making Low   ? PT Treatment/Interventions ADLs/Self Care Home Management;Biofeedback;Cryotherapy;Electrical Stimulation;Moist Heat;Neuromuscular re-education;Therapeutic exercise;Therapeutic activities;Patient/family education;Manual techniques;Dry needling;Passive range of motion;Spinal Manipulations   ? PT Next Visit Plan D/C   ? PT Home Exercise Plan [email or web address]   ? Consulted and Agree with Plan of Care Patient   ? ?  ?  ? ?  ? ? ?Patient will benefit from skilled therapeutic intervention in order to improve the following deficits and impairments:  Decreased mobility, Decreased strength, Decreased endurance, Decreased coordination, Decreased range of motion, Increased muscle spasms, Postural dysfunction, Impaired tone, Increased fascial restricitons, Decreased activity tolerance, Pain ? ?Visit Diagnosis: ?Muscle weakness (generalized) ? ?Other muscle spasm ? ?Unspecified lack of coordination ? ? ? ? ?Problem List ?Patient Active Problem List  ? Diagnosis Date Noted  ? Constipation 04/25/2021  ? PCB (post coital bleeding) 04/25/2021  ? Anxiety 11/14/2018  ? H/O rapid labor 04/25/2018  ? Abnormal TSH 07/28/2016  ? 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 Allergy to drug--benadryl 10/28/2014  ? 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 DEPRESSION, MILD 10/08/2008  ? ? ?Jule Economy, PT ?07/14/2021, 8:45 AM ? ?[facility] ?[facility] @ [facility] ?[place], [place], [address] ?Phone: [phone]   Fax:  [phone] ? ?Name: VIKTORIYA GLASPY ?MRN: [id number] ?Date of Birth: [birth year] ? ?PHYSICAL THERAPY DISCHARGE SUMMARY ? ?Visits from Start of Care: 7 ? ?Current functional level related to goals / functional outcomes: ?Independent and goals met ?  ?Remaining deficits: ?none ?  ?Education / Equipment: ?HEP   ? ?Patient agrees to discharge. Patient goals were met. Patient is being discharged due to meeting the stated rehab goals. ? ?Heather Roberts, PT, DPT03/02/238:45 AM ? ? ? ?

## 2021-08-08 ENCOUNTER — Telehealth: Payer: Self-pay | Admitting: *Deleted

## 2021-08-08 NOTE — Telephone Encounter (Signed)
Left patient a message to call and schedule 3 month follow up appointment with Dr. Gala Romney if she is still in need of that appointment. ?

## 2021-09-05 ENCOUNTER — Ambulatory Visit (INDEPENDENT_AMBULATORY_CARE_PROVIDER_SITE_OTHER): Payer: Medicaid Other | Admitting: Obstetrics & Gynecology

## 2021-09-05 ENCOUNTER — Encounter: Payer: Self-pay | Admitting: Obstetrics & Gynecology

## 2021-09-05 VITALS — BP 119/77 | HR 71 | Resp 16 | Ht 62.0 in | Wt 135.0 lb

## 2021-09-05 DIAGNOSIS — Z01419 Encounter for gynecological examination (general) (routine) without abnormal findings: Secondary | ICD-10-CM | POA: Diagnosis not present

## 2021-09-05 NOTE — Progress Notes (Signed)
Subjective:  ?  ? Amy Jacobson is a 36 y.o. female here for a routine exam.  Current complaints: none.  Post coital bleeding resolved.  Went to pelvic PT and improved.  ?  ?Gynecologic History ?Patient's last menstrual period was 08/24/2021. ?Contraception: coitus interruptus and rhythm method ?Last Mammogram: n/a ?Last Pap Smear:  09/12/19- neg ?Last Colon Screening;  n/a ?Seat Belts:   Yes ?Sun Screen:   No ?Dental Check Up:  No ?Brush & Floss:  Brush yes, floss  ? ?Obstetric History ?OB History  ?Gravida Para Term Preterm AB Living  ?4 4 4     4   ?SAB IAB Ectopic Multiple Live Births  ?      0 4  ?  ?# Outcome Date GA Lbr Len/2nd Weight Sex Delivery Anes PTL Lv  ?4 Term 11/20/18 [redacted]w[redacted]d 149:24 / 00:04 8 lb 2 oz (3.685 kg) F Vag-Spont None  LIV  ?3 Term 02/16/17 [redacted]w[redacted]d 07:19 / 00:01 7 lb 12.9 oz (3.541 kg) F Vag-Spont None  LIV  ?2 Term 10/29/14 [redacted]w[redacted]d 24:40 / 00:03 7 lb 1.9 oz (3.23 kg) F Vag-Spont EPI  LIV  ?   Birth Comments: facial bruising  ?1 Term 02/10/13 [redacted]w[redacted]d 12:29 / 01:25 7 lb 13.4 oz (3.555 kg) M Vag-Spont EPI  LIV  ?   Birth Comments:  ? ?Laboratory Number: [redacted]w[redacted]d ?Amy Jacobson ?3555 grams ? ?NBS Device Barcode: [id number] ? ?CAH: Normal ?GALACTOSEMIA: Normal ?THYROID: Normal ?BIOTINIDASE: Normal ?HEMOGLOBIN: Normal, FA ?CYSTIC FIBROSIS: Normal ?AMINO ACID PROFILE: Normal ?ACYLCARNITINE PROFILE: Normal  ? ? ? ?The following portions of the patient's history were reviewed and updated as appropriate: allergies, current medications, past family history, past medical history, past social history, past surgical history, and problem list. ? ?Review of Systems ?Pertinent items noted in HPI and remainder of comprehensive ROS otherwise negative.  ?  ?Objective:  ? ?  ?Vitals:  ? 09/05/21 0941  ?BP: 119/77  ?Pulse: 71  ?Resp: 16  ?Weight: 135 lb (61.2 kg)  ?Height: 5\' 2"  (1.575 m)  ? ?Vitals:  WNL ?General appearance: alert, cooperative and no distress ? ?HEENT: Normocephalic, without obvious abnormality,  atraumatic ?Eyes: negative ?Throat: lips, mucosa, and tongue normal; teeth and gums normal  ?Respiratory: Clear to auscultation bilaterally  ?CV: Regular rate and rhythm  ?Breasts:  Normal appearance, no masses or tenderness, no nipple retraction or dimpling  ?GI: Soft, non-tender; bowel sounds normal; no masses,  no organomegaly  ?GU: External Genitalia:  Tanner V, no lesion ?Urethra:  No prolapse   ?Vagina: Pink, normal rugae, no blood or discharge  ?Cervix: No CMT, no lesion  ?Uterus:  Normal size and contour, non tender  ?Adnexa: Normal, no masses, non tender  ?Musculoskeletal: No edema, redness or tenderness in the calves or thighs  ?Skin: No lesions or rash  ?Lymphatic: Axillary adenopathy: none     ?Psychiatric: Normal mood and behavior  ? ? ?   ? ?Assessment:  ? ? Healthy female exam.  ?  ?Plan:  ? ?Pap due 2024 ?Low risk for breast cancer; will begin screening around 42. ?Needs dental that accepts Medicaid--will send her a list.  ?

## 2021-09-05 NOTE — Progress Notes (Signed)
Last Mammogram: n/a ?Last Pap Smear:  09/12/19- neg ?Last Colon Screening;  n/a ?Seat Belts:   Yes ?Sun Screen:   No ?Dental Check Up:  No ?Brush & Floss:  Brush yes, floss no  ?

## 2022-09-12 NOTE — Progress Notes (Signed)
Last Mammogram: n/a Last Pap Smear:  09/12/19- negative Last Colon Screening;  n/a Seat Belts:   yes Sun Screen:   yes Dental Check Up:  yes Brush & Floss:  yes

## 2022-09-18 ENCOUNTER — Other Ambulatory Visit (HOSPITAL_COMMUNITY)
Admission: RE | Admit: 2022-09-18 | Discharge: 2022-09-18 | Disposition: A | Discharge status: Home / Self Care | Payer: Medicaid Other | Source: Ambulatory Visit | Attending: Obstetrics & Gynecology | Admitting: Obstetrics & Gynecology

## 2022-09-18 ENCOUNTER — Encounter: Payer: Self-pay | Admitting: Obstetrics & Gynecology

## 2022-09-18 ENCOUNTER — Ambulatory Visit (INDEPENDENT_AMBULATORY_CARE_PROVIDER_SITE_OTHER): Payer: Medicaid Other | Admitting: Obstetrics & Gynecology

## 2022-09-18 VITALS — BP 106/74 | HR 92 | Ht 62.0 in | Wt 114.0 lb

## 2022-09-18 DIAGNOSIS — Z01419 Encounter for gynecological examination (general) (routine) without abnormal findings: Secondary | ICD-10-CM

## 2022-09-18 NOTE — Progress Notes (Signed)
Subjective:     Amy Jacobson is a 37 y.o. female here for a routine exam.  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  She is very sad about the separation.  Her husband has a Veterinary surgeon and they also have a marital therapist.  She has a support structure through her church.  She denies depression symptoms today.   Gynecologic History No LMP recorded. Contraception: none Last Mammogram: n/a Last Pap Smear:  09/12/19- negative Last Colon Screening;  n/a Seat Belts:   yes Sun Screen:   yes Dental Check Up:  yes Brush & Floss:  yes   Obstetric History OB History  Gravida Para Term Preterm AB Living  4 4 4     4   SAB IAB Ectopic Multiple Live Births        0 4    # Outcome Date GA Lbr Len/2nd Weight Sex Delivery Anes PTL Lv  4 Term 11/20/18 [redacted]w[redacted]d 149:24 / 00:04 8 lb 2 oz (3.685 kg) F Vag-Spont None  LIV  3 Term 02/16/17 [redacted]w[redacted]d 07:19 / 00:01 7 lb 12.9 oz (3.541 kg) F Vag-Spont None  LIV  2 Term 10/29/14 [redacted]w[redacted]d 24:40 / 00:03 7 lb 1.9 oz (3.23 kg) F Vag-Spont EPI  LIV     Birth Comments: facial bruising  1 Term 02/10/13 [redacted]w[redacted]d 12:29 / 01:25 7 lb 13.4 oz (3.555 kg) M Vag-Spont EPI  LIV     Birth Comments:   Laboratory Number: [id number] Amy Jacobson 3555 grams  NBS Device Barcode: [id number]  CAH: Normal GALACTOSEMIA: Normal THYROID: Normal BIOTINIDASE: Normal HEMOGLOBIN: Normal, FA CYSTIC FIBROSIS: Normal AMINO ACID PROFILE: Normal ACYLCARNITINE PROFILE: Normal     The following portions of the patient's history were reviewed and updated as appropriate: allergies, current medications, past family history, past medical history, past social history, past surgical history, and problem list.  Review of Systems Pertinent items noted in HPI and remainder of comprehensive ROS otherwise negative.    Objective:     Vitals:   09/18/22 0806  Height: 5\' 2"  (1.575 m)   Vitals:  WNL General appearance: alert, cooperative and no  distress  HEENT: Normocephalic, without obvious abnormality, atraumatic Eyes: negative Throat: lips, mucosa, and tongue normal; teeth and gums normal  Respiratory: Clear to auscultation bilaterally  CV: Regular rate and rhythm  Breasts:  Normal appearance, no masses or tenderness, no nipple retraction or dimpling  GI: Soft, non-tender; bowel sounds normal; no masses,  no organomegaly  GU: External Genitalia:  Tanner V, no lesion Urethra:  No prolapse   Vagina: Pink, normal rugae, no blood or discharge  Cervix: No CMT, no lesion  Uterus:  Normal size and contour, non tender  Adnexa: Normal, no masses, non tender  Musculoskeletal: No edema, redness or tenderness in the calves or thighs  Skin: No lesions or rash  Lymphatic: Axillary adenopathy: none     Psychiatric: Normal mood and behavior        Assessment:    Healthy female exam.    Plan:   Pap with co testing Ensure to increase calories (BMI is 20.85) Marital seapration--continue counseling and meet with attorney

## 2022-09-19 LAB — CYTOLOGY - PAP
Adequacy: ABSENT
Chlamydia: NEGATIVE
Comment: NEGATIVE
Comment: NEGATIVE
Comment: NEGATIVE
Comment: NORMAL
Diagnosis: NEGATIVE
High risk HPV: NEGATIVE
Neisseria Gonorrhea: NEGATIVE
Trichomonas: NEGATIVE

## 2023-09-26 ENCOUNTER — Telehealth: Payer: Self-pay | Admitting: *Deleted

## 2023-09-26 NOTE — Telephone Encounter (Signed)
Left patient a message to call and schedule annual.
